# Patient Record
Sex: Male | Born: 1940 | Race: White | Hispanic: No | Marital: Married | Smoking: Former smoker
Health system: Southern US, Community
[De-identification: ages and names within clinical notes are randomized; demographics above are authoritative.]

## PROBLEM LIST (undated history)

## (undated) DIAGNOSIS — I10 Essential (primary) hypertension: Secondary | ICD-10-CM

## (undated) DIAGNOSIS — N189 Chronic kidney disease, unspecified: Secondary | ICD-10-CM

## (undated) HISTORY — PX: OTHER SURGICAL HISTORY: SHX169

## (undated) HISTORY — PX: NO PAST SURGERIES: SHX2092

---

## 2012-01-04 DIAGNOSIS — Z79899 Other long term (current) drug therapy: Secondary | ICD-10-CM | POA: Diagnosis not present

## 2012-01-04 DIAGNOSIS — E782 Mixed hyperlipidemia: Secondary | ICD-10-CM | POA: Diagnosis not present

## 2012-01-04 DIAGNOSIS — N182 Chronic kidney disease, stage 2 (mild): Secondary | ICD-10-CM | POA: Diagnosis not present

## 2012-01-04 DIAGNOSIS — E1129 Type 2 diabetes mellitus with other diabetic kidney complication: Secondary | ICD-10-CM | POA: Diagnosis not present

## 2012-01-07 DIAGNOSIS — N400 Enlarged prostate without lower urinary tract symptoms: Secondary | ICD-10-CM | POA: Diagnosis not present

## 2012-01-07 DIAGNOSIS — E1129 Type 2 diabetes mellitus with other diabetic kidney complication: Secondary | ICD-10-CM | POA: Diagnosis not present

## 2012-01-07 DIAGNOSIS — E782 Mixed hyperlipidemia: Secondary | ICD-10-CM | POA: Diagnosis not present

## 2012-01-07 DIAGNOSIS — I10 Essential (primary) hypertension: Secondary | ICD-10-CM | POA: Diagnosis not present

## 2012-02-02 DIAGNOSIS — H409 Unspecified glaucoma: Secondary | ICD-10-CM | POA: Diagnosis not present

## 2012-02-02 DIAGNOSIS — H4011X0 Primary open-angle glaucoma, stage unspecified: Secondary | ICD-10-CM | POA: Diagnosis not present

## 2012-02-02 DIAGNOSIS — Z961 Presence of intraocular lens: Secondary | ICD-10-CM | POA: Diagnosis not present

## 2012-02-02 DIAGNOSIS — E119 Type 2 diabetes mellitus without complications: Secondary | ICD-10-CM | POA: Diagnosis not present

## 2012-05-17 DIAGNOSIS — E782 Mixed hyperlipidemia: Secondary | ICD-10-CM | POA: Diagnosis not present

## 2012-05-17 DIAGNOSIS — E1129 Type 2 diabetes mellitus with other diabetic kidney complication: Secondary | ICD-10-CM | POA: Diagnosis not present

## 2012-05-17 DIAGNOSIS — N182 Chronic kidney disease, stage 2 (mild): Secondary | ICD-10-CM | POA: Diagnosis not present

## 2012-05-19 DIAGNOSIS — E1129 Type 2 diabetes mellitus with other diabetic kidney complication: Secondary | ICD-10-CM | POA: Diagnosis not present

## 2012-05-19 DIAGNOSIS — E782 Mixed hyperlipidemia: Secondary | ICD-10-CM | POA: Diagnosis not present

## 2012-05-19 DIAGNOSIS — N182 Chronic kidney disease, stage 2 (mild): Secondary | ICD-10-CM | POA: Diagnosis not present

## 2012-05-19 DIAGNOSIS — I10 Essential (primary) hypertension: Secondary | ICD-10-CM | POA: Diagnosis not present

## 2012-07-27 DIAGNOSIS — H4011X0 Primary open-angle glaucoma, stage unspecified: Secondary | ICD-10-CM | POA: Diagnosis not present

## 2012-07-27 DIAGNOSIS — H409 Unspecified glaucoma: Secondary | ICD-10-CM | POA: Diagnosis not present

## 2012-09-19 DIAGNOSIS — E1129 Type 2 diabetes mellitus with other diabetic kidney complication: Secondary | ICD-10-CM | POA: Diagnosis not present

## 2012-09-19 DIAGNOSIS — E782 Mixed hyperlipidemia: Secondary | ICD-10-CM | POA: Diagnosis not present

## 2012-09-19 DIAGNOSIS — Z125 Encounter for screening for malignant neoplasm of prostate: Secondary | ICD-10-CM | POA: Diagnosis not present

## 2012-09-22 DIAGNOSIS — N182 Chronic kidney disease, stage 2 (mild): Secondary | ICD-10-CM | POA: Diagnosis not present

## 2012-09-22 DIAGNOSIS — I10 Essential (primary) hypertension: Secondary | ICD-10-CM | POA: Diagnosis not present

## 2012-09-22 DIAGNOSIS — E782 Mixed hyperlipidemia: Secondary | ICD-10-CM | POA: Diagnosis not present

## 2012-09-22 DIAGNOSIS — E1129 Type 2 diabetes mellitus with other diabetic kidney complication: Secondary | ICD-10-CM | POA: Diagnosis not present

## 2013-01-24 DIAGNOSIS — E1129 Type 2 diabetes mellitus with other diabetic kidney complication: Secondary | ICD-10-CM | POA: Diagnosis not present

## 2013-01-24 DIAGNOSIS — E782 Mixed hyperlipidemia: Secondary | ICD-10-CM | POA: Diagnosis not present

## 2013-01-26 DIAGNOSIS — Z9181 History of falling: Secondary | ICD-10-CM | POA: Diagnosis not present

## 2013-01-26 DIAGNOSIS — Z6828 Body mass index (BMI) 28.0-28.9, adult: Secondary | ICD-10-CM | POA: Diagnosis not present

## 2013-01-26 DIAGNOSIS — Z1331 Encounter for screening for depression: Secondary | ICD-10-CM | POA: Diagnosis not present

## 2013-01-26 DIAGNOSIS — E1129 Type 2 diabetes mellitus with other diabetic kidney complication: Secondary | ICD-10-CM | POA: Diagnosis not present

## 2013-01-29 DIAGNOSIS — H409 Unspecified glaucoma: Secondary | ICD-10-CM | POA: Diagnosis not present

## 2013-01-29 DIAGNOSIS — E119 Type 2 diabetes mellitus without complications: Secondary | ICD-10-CM | POA: Diagnosis not present

## 2013-01-29 DIAGNOSIS — H4011X0 Primary open-angle glaucoma, stage unspecified: Secondary | ICD-10-CM | POA: Diagnosis not present

## 2013-05-28 DIAGNOSIS — E1129 Type 2 diabetes mellitus with other diabetic kidney complication: Secondary | ICD-10-CM | POA: Diagnosis not present

## 2013-05-28 DIAGNOSIS — E782 Mixed hyperlipidemia: Secondary | ICD-10-CM | POA: Diagnosis not present

## 2013-05-30 DIAGNOSIS — E782 Mixed hyperlipidemia: Secondary | ICD-10-CM | POA: Diagnosis not present

## 2013-05-30 DIAGNOSIS — N182 Chronic kidney disease, stage 2 (mild): Secondary | ICD-10-CM | POA: Diagnosis not present

## 2013-05-30 DIAGNOSIS — E1129 Type 2 diabetes mellitus with other diabetic kidney complication: Secondary | ICD-10-CM | POA: Diagnosis not present

## 2013-05-30 DIAGNOSIS — I10 Essential (primary) hypertension: Secondary | ICD-10-CM | POA: Diagnosis not present

## 2013-08-23 DIAGNOSIS — E119 Type 2 diabetes mellitus without complications: Secondary | ICD-10-CM | POA: Diagnosis not present

## 2013-08-23 DIAGNOSIS — H4011X0 Primary open-angle glaucoma, stage unspecified: Secondary | ICD-10-CM | POA: Diagnosis not present

## 2013-08-23 DIAGNOSIS — H409 Unspecified glaucoma: Secondary | ICD-10-CM | POA: Diagnosis not present

## 2013-09-28 DIAGNOSIS — Z125 Encounter for screening for malignant neoplasm of prostate: Secondary | ICD-10-CM | POA: Diagnosis not present

## 2013-09-28 DIAGNOSIS — E1129 Type 2 diabetes mellitus with other diabetic kidney complication: Secondary | ICD-10-CM | POA: Diagnosis not present

## 2013-09-28 DIAGNOSIS — E782 Mixed hyperlipidemia: Secondary | ICD-10-CM | POA: Diagnosis not present

## 2013-10-03 DIAGNOSIS — I129 Hypertensive chronic kidney disease with stage 1 through stage 4 chronic kidney disease, or unspecified chronic kidney disease: Secondary | ICD-10-CM | POA: Diagnosis not present

## 2013-10-03 DIAGNOSIS — E782 Mixed hyperlipidemia: Secondary | ICD-10-CM | POA: Diagnosis not present

## 2013-10-03 DIAGNOSIS — E1129 Type 2 diabetes mellitus with other diabetic kidney complication: Secondary | ICD-10-CM | POA: Diagnosis not present

## 2014-02-12 DIAGNOSIS — E1129 Type 2 diabetes mellitus with other diabetic kidney complication: Secondary | ICD-10-CM | POA: Diagnosis not present

## 2014-02-12 DIAGNOSIS — N183 Chronic kidney disease, stage 3 unspecified: Secondary | ICD-10-CM | POA: Diagnosis not present

## 2014-02-12 DIAGNOSIS — E782 Mixed hyperlipidemia: Secondary | ICD-10-CM | POA: Diagnosis not present

## 2014-02-15 DIAGNOSIS — Z1342 Encounter for screening for global developmental delays (milestones): Secondary | ICD-10-CM | POA: Diagnosis not present

## 2014-02-15 DIAGNOSIS — H4011X0 Primary open-angle glaucoma, stage unspecified: Secondary | ICD-10-CM | POA: Diagnosis not present

## 2014-02-15 DIAGNOSIS — E1129 Type 2 diabetes mellitus with other diabetic kidney complication: Secondary | ICD-10-CM | POA: Diagnosis not present

## 2014-02-15 DIAGNOSIS — Z1330 Encounter for screening examination for mental health and behavioral disorders, unspecified: Secondary | ICD-10-CM | POA: Diagnosis not present

## 2014-02-15 DIAGNOSIS — Z9181 History of falling: Secondary | ICD-10-CM | POA: Diagnosis not present

## 2014-02-15 DIAGNOSIS — H409 Unspecified glaucoma: Secondary | ICD-10-CM | POA: Diagnosis not present

## 2014-02-15 DIAGNOSIS — N400 Enlarged prostate without lower urinary tract symptoms: Secondary | ICD-10-CM | POA: Diagnosis not present

## 2014-02-15 DIAGNOSIS — I10 Essential (primary) hypertension: Secondary | ICD-10-CM | POA: Diagnosis not present

## 2014-02-15 DIAGNOSIS — E782 Mixed hyperlipidemia: Secondary | ICD-10-CM | POA: Diagnosis not present

## 2014-06-18 DIAGNOSIS — E782 Mixed hyperlipidemia: Secondary | ICD-10-CM | POA: Diagnosis not present

## 2014-06-18 DIAGNOSIS — E1129 Type 2 diabetes mellitus with other diabetic kidney complication: Secondary | ICD-10-CM | POA: Diagnosis not present

## 2014-06-19 DIAGNOSIS — N400 Enlarged prostate without lower urinary tract symptoms: Secondary | ICD-10-CM | POA: Diagnosis not present

## 2014-06-19 DIAGNOSIS — E1129 Type 2 diabetes mellitus with other diabetic kidney complication: Secondary | ICD-10-CM | POA: Diagnosis not present

## 2014-06-19 DIAGNOSIS — I10 Essential (primary) hypertension: Secondary | ICD-10-CM | POA: Diagnosis not present

## 2014-06-19 DIAGNOSIS — E782 Mixed hyperlipidemia: Secondary | ICD-10-CM | POA: Diagnosis not present

## 2014-10-22 DIAGNOSIS — E1129 Type 2 diabetes mellitus with other diabetic kidney complication: Secondary | ICD-10-CM | POA: Diagnosis not present

## 2014-10-22 DIAGNOSIS — E782 Mixed hyperlipidemia: Secondary | ICD-10-CM | POA: Diagnosis not present

## 2014-10-22 DIAGNOSIS — Z125 Encounter for screening for malignant neoplasm of prostate: Secondary | ICD-10-CM | POA: Diagnosis not present

## 2014-10-28 DIAGNOSIS — N183 Chronic kidney disease, stage 3 (moderate): Secondary | ICD-10-CM | POA: Diagnosis not present

## 2014-10-28 DIAGNOSIS — E1129 Type 2 diabetes mellitus with other diabetic kidney complication: Secondary | ICD-10-CM | POA: Diagnosis not present

## 2014-10-28 DIAGNOSIS — Z1389 Encounter for screening for other disorder: Secondary | ICD-10-CM | POA: Diagnosis not present

## 2014-10-28 DIAGNOSIS — Z9181 History of falling: Secondary | ICD-10-CM | POA: Diagnosis not present

## 2014-10-28 DIAGNOSIS — Z6827 Body mass index (BMI) 27.0-27.9, adult: Secondary | ICD-10-CM | POA: Diagnosis not present

## 2014-10-28 DIAGNOSIS — I10 Essential (primary) hypertension: Secondary | ICD-10-CM | POA: Diagnosis not present

## 2014-10-28 DIAGNOSIS — E782 Mixed hyperlipidemia: Secondary | ICD-10-CM | POA: Diagnosis not present

## 2014-10-28 DIAGNOSIS — N400 Enlarged prostate without lower urinary tract symptoms: Secondary | ICD-10-CM | POA: Diagnosis not present

## 2015-03-03 DIAGNOSIS — E782 Mixed hyperlipidemia: Secondary | ICD-10-CM | POA: Diagnosis not present

## 2015-03-03 DIAGNOSIS — N182 Chronic kidney disease, stage 2 (mild): Secondary | ICD-10-CM | POA: Diagnosis not present

## 2015-03-03 DIAGNOSIS — E1129 Type 2 diabetes mellitus with other diabetic kidney complication: Secondary | ICD-10-CM | POA: Diagnosis not present

## 2015-03-11 DIAGNOSIS — N183 Chronic kidney disease, stage 3 (moderate): Secondary | ICD-10-CM | POA: Diagnosis not present

## 2015-03-11 DIAGNOSIS — I10 Essential (primary) hypertension: Secondary | ICD-10-CM | POA: Diagnosis not present

## 2015-03-11 DIAGNOSIS — E1129 Type 2 diabetes mellitus with other diabetic kidney complication: Secondary | ICD-10-CM | POA: Diagnosis not present

## 2015-03-11 DIAGNOSIS — N400 Enlarged prostate without lower urinary tract symptoms: Secondary | ICD-10-CM | POA: Diagnosis not present

## 2015-03-11 DIAGNOSIS — Z1389 Encounter for screening for other disorder: Secondary | ICD-10-CM | POA: Diagnosis not present

## 2015-03-11 DIAGNOSIS — Z6827 Body mass index (BMI) 27.0-27.9, adult: Secondary | ICD-10-CM | POA: Diagnosis not present

## 2015-03-11 DIAGNOSIS — E782 Mixed hyperlipidemia: Secondary | ICD-10-CM | POA: Diagnosis not present

## 2015-03-11 DIAGNOSIS — Z9181 History of falling: Secondary | ICD-10-CM | POA: Diagnosis not present

## 2015-07-01 DIAGNOSIS — H4011X1 Primary open-angle glaucoma, mild stage: Secondary | ICD-10-CM | POA: Diagnosis not present

## 2015-07-01 DIAGNOSIS — E119 Type 2 diabetes mellitus without complications: Secondary | ICD-10-CM | POA: Diagnosis not present

## 2015-07-09 DIAGNOSIS — E782 Mixed hyperlipidemia: Secondary | ICD-10-CM | POA: Diagnosis not present

## 2015-07-09 DIAGNOSIS — E1129 Type 2 diabetes mellitus with other diabetic kidney complication: Secondary | ICD-10-CM | POA: Diagnosis not present

## 2015-07-11 DIAGNOSIS — L570 Actinic keratosis: Secondary | ICD-10-CM | POA: Diagnosis not present

## 2015-07-11 DIAGNOSIS — Z9181 History of falling: Secondary | ICD-10-CM | POA: Diagnosis not present

## 2015-07-11 DIAGNOSIS — Z1389 Encounter for screening for other disorder: Secondary | ICD-10-CM | POA: Diagnosis not present

## 2015-07-11 DIAGNOSIS — E1129 Type 2 diabetes mellitus with other diabetic kidney complication: Secondary | ICD-10-CM | POA: Diagnosis not present

## 2015-07-11 DIAGNOSIS — N183 Chronic kidney disease, stage 3 (moderate): Secondary | ICD-10-CM | POA: Diagnosis not present

## 2015-07-11 DIAGNOSIS — E782 Mixed hyperlipidemia: Secondary | ICD-10-CM | POA: Diagnosis not present

## 2015-07-11 DIAGNOSIS — I10 Essential (primary) hypertension: Secondary | ICD-10-CM | POA: Diagnosis not present

## 2015-07-11 DIAGNOSIS — Z6827 Body mass index (BMI) 27.0-27.9, adult: Secondary | ICD-10-CM | POA: Diagnosis not present

## 2015-09-29 DIAGNOSIS — N63 Unspecified lump in breast: Secondary | ICD-10-CM | POA: Diagnosis not present

## 2015-09-29 DIAGNOSIS — Z6828 Body mass index (BMI) 28.0-28.9, adult: Secondary | ICD-10-CM | POA: Diagnosis not present

## 2015-10-13 DIAGNOSIS — H401111 Primary open-angle glaucoma, right eye, mild stage: Secondary | ICD-10-CM | POA: Diagnosis not present

## 2015-10-13 DIAGNOSIS — H52203 Unspecified astigmatism, bilateral: Secondary | ICD-10-CM | POA: Diagnosis not present

## 2015-10-13 DIAGNOSIS — Z961 Presence of intraocular lens: Secondary | ICD-10-CM | POA: Diagnosis not present

## 2015-10-13 DIAGNOSIS — H401122 Primary open-angle glaucoma, left eye, moderate stage: Secondary | ICD-10-CM | POA: Diagnosis not present

## 2015-11-11 DIAGNOSIS — E1129 Type 2 diabetes mellitus with other diabetic kidney complication: Secondary | ICD-10-CM | POA: Diagnosis not present

## 2015-11-11 DIAGNOSIS — E782 Mixed hyperlipidemia: Secondary | ICD-10-CM | POA: Diagnosis not present

## 2015-11-11 DIAGNOSIS — Z125 Encounter for screening for malignant neoplasm of prostate: Secondary | ICD-10-CM | POA: Diagnosis not present

## 2015-11-14 DIAGNOSIS — Z6828 Body mass index (BMI) 28.0-28.9, adult: Secondary | ICD-10-CM | POA: Diagnosis not present

## 2015-11-14 DIAGNOSIS — E782 Mixed hyperlipidemia: Secondary | ICD-10-CM | POA: Diagnosis not present

## 2015-11-14 DIAGNOSIS — E1129 Type 2 diabetes mellitus with other diabetic kidney complication: Secondary | ICD-10-CM | POA: Diagnosis not present

## 2015-11-14 DIAGNOSIS — N183 Chronic kidney disease, stage 3 (moderate): Secondary | ICD-10-CM | POA: Diagnosis not present

## 2015-11-14 DIAGNOSIS — I10 Essential (primary) hypertension: Secondary | ICD-10-CM | POA: Diagnosis not present

## 2015-12-17 DIAGNOSIS — Z6828 Body mass index (BMI) 28.0-28.9, adult: Secondary | ICD-10-CM | POA: Diagnosis not present

## 2015-12-17 DIAGNOSIS — E663 Overweight: Secondary | ICD-10-CM | POA: Diagnosis not present

## 2015-12-17 DIAGNOSIS — R220 Localized swelling, mass and lump, head: Secondary | ICD-10-CM | POA: Diagnosis not present

## 2015-12-25 DIAGNOSIS — D170 Benign lipomatous neoplasm of skin and subcutaneous tissue of head, face and neck: Secondary | ICD-10-CM | POA: Diagnosis not present

## 2016-01-16 DIAGNOSIS — Z7984 Long term (current) use of oral hypoglycemic drugs: Secondary | ICD-10-CM | POA: Diagnosis not present

## 2016-01-16 DIAGNOSIS — D170 Benign lipomatous neoplasm of skin and subcutaneous tissue of head, face and neck: Secondary | ICD-10-CM | POA: Diagnosis not present

## 2016-01-16 DIAGNOSIS — I10 Essential (primary) hypertension: Secondary | ICD-10-CM | POA: Diagnosis not present

## 2016-01-16 DIAGNOSIS — Z79899 Other long term (current) drug therapy: Secondary | ICD-10-CM | POA: Diagnosis not present

## 2016-01-16 DIAGNOSIS — Z7982 Long term (current) use of aspirin: Secondary | ICD-10-CM | POA: Diagnosis not present

## 2016-01-16 DIAGNOSIS — E119 Type 2 diabetes mellitus without complications: Secondary | ICD-10-CM | POA: Diagnosis not present

## 2016-02-09 DIAGNOSIS — I10 Essential (primary) hypertension: Secondary | ICD-10-CM | POA: Diagnosis not present

## 2016-02-09 DIAGNOSIS — Z6828 Body mass index (BMI) 28.0-28.9, adult: Secondary | ICD-10-CM | POA: Diagnosis not present

## 2016-02-09 DIAGNOSIS — R600 Localized edema: Secondary | ICD-10-CM | POA: Diagnosis not present

## 2016-03-15 DIAGNOSIS — E1129 Type 2 diabetes mellitus with other diabetic kidney complication: Secondary | ICD-10-CM | POA: Diagnosis not present

## 2016-03-15 DIAGNOSIS — E782 Mixed hyperlipidemia: Secondary | ICD-10-CM | POA: Diagnosis not present

## 2016-03-19 DIAGNOSIS — E1129 Type 2 diabetes mellitus with other diabetic kidney complication: Secondary | ICD-10-CM | POA: Diagnosis not present

## 2016-03-19 DIAGNOSIS — E782 Mixed hyperlipidemia: Secondary | ICD-10-CM | POA: Diagnosis not present

## 2016-03-19 DIAGNOSIS — R809 Proteinuria, unspecified: Secondary | ICD-10-CM | POA: Diagnosis not present

## 2016-03-19 DIAGNOSIS — E663 Overweight: Secondary | ICD-10-CM | POA: Diagnosis not present

## 2016-03-19 DIAGNOSIS — I10 Essential (primary) hypertension: Secondary | ICD-10-CM | POA: Diagnosis not present

## 2016-03-19 DIAGNOSIS — Z6828 Body mass index (BMI) 28.0-28.9, adult: Secondary | ICD-10-CM | POA: Diagnosis not present

## 2016-03-19 DIAGNOSIS — N183 Chronic kidney disease, stage 3 (moderate): Secondary | ICD-10-CM | POA: Diagnosis not present

## 2016-03-26 DIAGNOSIS — Z6828 Body mass index (BMI) 28.0-28.9, adult: Secondary | ICD-10-CM | POA: Diagnosis not present

## 2016-03-26 DIAGNOSIS — H6123 Impacted cerumen, bilateral: Secondary | ICD-10-CM | POA: Diagnosis not present

## 2016-04-12 DIAGNOSIS — H401122 Primary open-angle glaucoma, left eye, moderate stage: Secondary | ICD-10-CM | POA: Diagnosis not present

## 2016-04-12 DIAGNOSIS — E119 Type 2 diabetes mellitus without complications: Secondary | ICD-10-CM | POA: Diagnosis not present

## 2016-04-12 DIAGNOSIS — H401111 Primary open-angle glaucoma, right eye, mild stage: Secondary | ICD-10-CM | POA: Diagnosis not present

## 2016-06-01 DIAGNOSIS — N183 Chronic kidney disease, stage 3 (moderate): Secondary | ICD-10-CM | POA: Diagnosis not present

## 2016-06-01 DIAGNOSIS — D631 Anemia in chronic kidney disease: Secondary | ICD-10-CM | POA: Diagnosis not present

## 2016-06-01 DIAGNOSIS — N2581 Secondary hyperparathyroidism of renal origin: Secondary | ICD-10-CM | POA: Diagnosis not present

## 2016-06-01 DIAGNOSIS — I10 Essential (primary) hypertension: Secondary | ICD-10-CM | POA: Diagnosis not present

## 2016-06-01 DIAGNOSIS — E1129 Type 2 diabetes mellitus with other diabetic kidney complication: Secondary | ICD-10-CM | POA: Diagnosis not present

## 2016-07-20 DIAGNOSIS — N183 Chronic kidney disease, stage 3 (moderate): Secondary | ICD-10-CM | POA: Diagnosis not present

## 2016-07-20 DIAGNOSIS — E782 Mixed hyperlipidemia: Secondary | ICD-10-CM | POA: Diagnosis not present

## 2016-07-20 DIAGNOSIS — E1129 Type 2 diabetes mellitus with other diabetic kidney complication: Secondary | ICD-10-CM | POA: Diagnosis not present

## 2016-07-22 DIAGNOSIS — Z9181 History of falling: Secondary | ICD-10-CM | POA: Diagnosis not present

## 2016-07-22 DIAGNOSIS — E1129 Type 2 diabetes mellitus with other diabetic kidney complication: Secondary | ICD-10-CM | POA: Diagnosis not present

## 2016-07-22 DIAGNOSIS — Z6828 Body mass index (BMI) 28.0-28.9, adult: Secondary | ICD-10-CM | POA: Diagnosis not present

## 2016-07-22 DIAGNOSIS — I10 Essential (primary) hypertension: Secondary | ICD-10-CM | POA: Diagnosis not present

## 2016-07-22 DIAGNOSIS — E782 Mixed hyperlipidemia: Secondary | ICD-10-CM | POA: Diagnosis not present

## 2016-07-22 DIAGNOSIS — N183 Chronic kidney disease, stage 3 (moderate): Secondary | ICD-10-CM | POA: Diagnosis not present

## 2016-07-22 DIAGNOSIS — Z1389 Encounter for screening for other disorder: Secondary | ICD-10-CM | POA: Diagnosis not present

## 2016-09-07 DIAGNOSIS — N2581 Secondary hyperparathyroidism of renal origin: Secondary | ICD-10-CM | POA: Diagnosis not present

## 2016-09-07 DIAGNOSIS — N183 Chronic kidney disease, stage 3 (moderate): Secondary | ICD-10-CM | POA: Diagnosis not present

## 2016-09-14 DIAGNOSIS — N2581 Secondary hyperparathyroidism of renal origin: Secondary | ICD-10-CM | POA: Diagnosis not present

## 2016-09-14 DIAGNOSIS — N183 Chronic kidney disease, stage 3 (moderate): Secondary | ICD-10-CM | POA: Diagnosis not present

## 2016-09-14 DIAGNOSIS — D631 Anemia in chronic kidney disease: Secondary | ICD-10-CM | POA: Diagnosis not present

## 2016-09-14 DIAGNOSIS — Z6829 Body mass index (BMI) 29.0-29.9, adult: Secondary | ICD-10-CM | POA: Diagnosis not present

## 2016-09-14 DIAGNOSIS — I129 Hypertensive chronic kidney disease with stage 1 through stage 4 chronic kidney disease, or unspecified chronic kidney disease: Secondary | ICD-10-CM | POA: Diagnosis not present

## 2016-09-15 ENCOUNTER — Other Ambulatory Visit (HOSPITAL_COMMUNITY): Payer: Self-pay | Admitting: Nephrology

## 2016-09-15 DIAGNOSIS — N183 Chronic kidney disease, stage 3 unspecified: Secondary | ICD-10-CM

## 2016-09-23 ENCOUNTER — Other Ambulatory Visit: Payer: Self-pay | Admitting: Radiology

## 2016-09-24 ENCOUNTER — Other Ambulatory Visit: Payer: Self-pay | Admitting: Student

## 2016-09-27 ENCOUNTER — Encounter (HOSPITAL_COMMUNITY): Payer: Self-pay

## 2016-09-27 ENCOUNTER — Ambulatory Visit (HOSPITAL_COMMUNITY)
Admission: RE | Admit: 2016-09-27 | Discharge: 2016-09-27 | Disposition: A | Discharge status: Home / Self Care | Payer: Medicare Other | Source: Ambulatory Visit | Attending: Nephrology | Admitting: Nephrology

## 2016-09-27 DIAGNOSIS — E1122 Type 2 diabetes mellitus with diabetic chronic kidney disease: Secondary | ICD-10-CM | POA: Insufficient documentation

## 2016-09-27 DIAGNOSIS — N183 Chronic kidney disease, stage 3 unspecified: Secondary | ICD-10-CM

## 2016-09-27 DIAGNOSIS — Z87891 Personal history of nicotine dependence: Secondary | ICD-10-CM | POA: Insufficient documentation

## 2016-09-27 DIAGNOSIS — Z7982 Long term (current) use of aspirin: Secondary | ICD-10-CM | POA: Insufficient documentation

## 2016-09-27 DIAGNOSIS — I129 Hypertensive chronic kidney disease with stage 1 through stage 4 chronic kidney disease, or unspecified chronic kidney disease: Secondary | ICD-10-CM | POA: Diagnosis not present

## 2016-09-27 DIAGNOSIS — Z79899 Other long term (current) drug therapy: Secondary | ICD-10-CM | POA: Insufficient documentation

## 2016-09-27 DIAGNOSIS — N049 Nephrotic syndrome with unspecified morphologic changes: Secondary | ICD-10-CM | POA: Diagnosis not present

## 2016-09-27 HISTORY — DX: Chronic kidney disease, unspecified: N18.9

## 2016-09-27 HISTORY — DX: Essential (primary) hypertension: I10

## 2016-09-27 LAB — BASIC METABOLIC PANEL
Anion gap: 12 (ref 5–15)
BUN: 28 mg/dL — ABNORMAL HIGH (ref 6–20)
CO2: 24 mmol/L (ref 22–32)
Calcium: 9.3 mg/dL (ref 8.9–10.3)
Chloride: 100 mmol/L — ABNORMAL LOW (ref 101–111)
Creatinine, Ser: 1.73 mg/dL — ABNORMAL HIGH (ref 0.61–1.24)
GFR calc Af Amer: 42 mL/min — ABNORMAL LOW (ref >60)
GFR calc non Af Amer: 37 mL/min — ABNORMAL LOW (ref >60)
GLUCOSE: 156 mg/dL — AB (ref 65–99)
Potassium: 3.4 mmol/L — ABNORMAL LOW (ref 3.5–5.1)
SODIUM: 136 mmol/L (ref 135–145)

## 2016-09-27 LAB — CBC WITH DIFFERENTIAL/PLATELET
BASOS PCT: 2 %
Basophils Absolute: 0.1 10*3/uL (ref 0.0–0.1)
Eosinophils Absolute: 0.3 10*3/uL (ref 0.0–0.7)
Eosinophils Relative: 4 %
HEMATOCRIT: 37.3 % — AB (ref 39.0–52.0)
HEMOGLOBIN: 13.3 g/dL (ref 13.0–17.0)
LYMPHS PCT: 43 %
Lymphs Abs: 2.7 10*3/uL (ref 0.7–4.0)
MCH: 32.9 pg (ref 26.0–34.0)
MCHC: 35.7 g/dL (ref 30.0–36.0)
MCV: 92.3 fL (ref 78.0–100.0)
MONO ABS: 0.6 10*3/uL (ref 0.1–1.0)
MONOS PCT: 9 %
NEUTROS ABS: 2.6 10*3/uL (ref 1.7–7.7)
NEUTROS PCT: 42 %
Platelets: 295 10*3/uL (ref 150–400)
RBC: 4.04 MIL/uL — ABNORMAL LOW (ref 4.22–5.81)
RDW: 12.2 % (ref 11.5–15.5)
WBC: 6.2 10*3/uL (ref 4.0–10.5)

## 2016-09-27 LAB — PROTIME-INR
INR: 0.86
Prothrombin Time: 11.7 seconds (ref 11.4–15.2)

## 2016-09-27 MED ORDER — MIDAZOLAM HCL 2 MG/2ML IJ SOLN
INTRAMUSCULAR | Status: AC
  Filled 2016-09-27: qty 2

## 2016-09-27 MED ORDER — LIDOCAINE HCL 1 % IJ SOLN
INTRAMUSCULAR | Status: AC
  Filled 2016-09-27: qty 20

## 2016-09-27 MED ORDER — SODIUM CHLORIDE 0.9 % IV SOLN: INTRAVENOUS | Status: DC

## 2016-09-27 MED ORDER — FENTANYL CITRATE (PF) 100 MCG/2ML IJ SOLN
INTRAMUSCULAR | Status: AC
  Filled 2016-09-27: qty 2

## 2016-09-27 MED ORDER — FENTANYL CITRATE (PF) 100 MCG/2ML IJ SOLN
INTRAMUSCULAR | Status: AC | PRN
  Administered 2016-09-27: 50 ug via INTRAVENOUS
  Administered 2016-09-27: 25 ug via INTRAVENOUS

## 2016-09-27 MED ORDER — MIDAZOLAM HCL 2 MG/2ML IJ SOLN
INTRAMUSCULAR | Status: AC | PRN
  Administered 2016-09-27: 0.5 mg via INTRAVENOUS
  Administered 2016-09-27: 1 mg via INTRAVENOUS
  Administered 2016-09-27: 0.5 mg via INTRAVENOUS

## 2016-09-27 MED ORDER — SODIUM CHLORIDE 0.9 % IV SOLN
INTRAVENOUS | Status: AC | PRN
  Administered 2016-09-27: 10 mL/h via INTRAVENOUS

## 2016-09-27 NOTE — Procedures (Signed)
[place] RLP renal core 16g x2 to surg path No complication No blood loss. See complete dictation in [facility].

## 2016-09-27 NOTE — Discharge Instructions (Signed)
Percutaneous Kidney Biopsy, Care After °This sheet gives you information about how to care for yourself after your procedure. Your health care provider may also give you more specific instructions. If you have problems or questions, contact your health care provider. °What can I expect after the procedure? °After the procedure, it is common to have: °· Pain or soreness near the area where the needle went through your skin (biopsy site). °· Bright pink or cloudy urine for 24 hours after the procedure. °Follow these instructions at home: °Activity  °· Return to your normal activities as told by your health care provider. Ask your health care provider what activities are safe for you. °· Do not drive for 24 hours if you were given a medicine to help you relax (sedative). °· Do not lift anything that is heavier than 10 lb (4.5 kg) until your health care provider tells you that it is safe. °· Avoid activities that take a lot of effort (are strenuous) until your health care provider approves. Most people will have to wait 2 weeks before returning to activities such as exercise or sexual intercourse. °General instructions  °· Take over-the-counter and prescription medicines only as told by your health care provider. °· You may eat and drink after your procedure. Follow instructions from your health care provider about eating or drinking restrictions. °· Check your biopsy site every day for signs of infection. Check for: °¨ More redness, swelling, or pain. °¨ More fluid or blood. °¨ Warmth. °¨ Pus or a bad smell. °· Keep all follow-up visits as told by your health care provider. This is important. °Contact a health care provider if: °· You have more redness, swelling, or pain around your biopsy site. °· You have more fluid or blood coming from your biopsy site. °· Your biopsy site feels warm to the touch. °· You have pus or a bad smell coming from your biopsy site. °· You have blood in your urine more than 24 hours after  your procedure. °Get help right away if: °· You have dark red or brown urine. °· You have a fever. °· You are unable to urinate. °· You feel burning when you urinate. °· You feel faint. °· You have severe pain in your abdomen or side. °This information is not intended to replace advice given to you by your health care provider. Make sure you discuss any questions you have with your health care provider. °Document Released: 06/13/2013 Document Revised: 07/23/2016 Document Reviewed: 07/23/2016 °Elsevier Interactive Patient Education © 2017 [employer] ° °

## 2016-09-27 NOTE — H&P (Signed)
Chief Complaint: Patient was seen in consultation today for random renal biopsy at the request of [place]  Referring Physician(s): Patel,Jay  Supervising Physician: Arne Cleveland  Patient Status: [facility] - Out-pt  History of Present Illness: Charles Hanson. is a 75 y.o. male   Hx HTN; DM Known CKD stage III Follows with Dr Posey Pronto Persistent proteinuria Nephrotic syndrome Scheduled now for random renal biopsy    Past Medical History:  Diagnosis Date  . Chronic kidney disease   . Hypertension     Past Surgical History:  Procedure Laterality Date  . NO PAST SURGERIES      Allergies: Patient has no known allergies.  Medications: Prior to Admission medications   Medication Sig Start Date End Date Taking? Authorizing Provider  amLODipine (NORVASC) 10 MG tablet Take 10 mg by mouth daily.   Yes Historical Provider, MD  aspirin 81 MG tablet Take 81 mg by mouth daily.   Yes Historical Provider, MD  Cholecalciferol (VITAMIN D3) 2000 units TABS Take 2 tablets by mouth daily.   Yes Historical Provider, MD  dorzolamide (TRUSOPT) 2 % ophthalmic solution Place 1 drop into both eyes 2 (two) times daily.   Yes Historical Provider, MD  latanoprost (XALATAN) 0.005 % ophthalmic solution Place 1 drop into both eyes at bedtime.   Yes Historical Provider, MD  losartan-hydrochlorothiazide (HYZAAR) 100-25 MG tablet Take 1 tablet by mouth daily.   Yes Historical Provider, MD  nebivolol (BYSTOLIC) 10 MG tablet Take 10 mg by mouth daily.   Yes Historical Provider, MD  Omega-3 Fatty Acids (FISH OIL OMEGA-3 PO) Take 1,600 mg elemental calcium/kg/hr by mouth daily.   Yes Historical Provider, MD  simvastatin (ZOCOR) 20 MG tablet Take 20 mg by mouth daily.   Yes Historical Provider, MD     Family History  Problem Relation Age of Onset  . Cancer - Lung Mother   . Kidney disease Father   . Heart failure Father     Social History   Social History  . Marital status: Married    Spouse  name: N/A  . Number of children: N/A  . Years of education: N/A   Social History Main Topics  . Smoking status: Former Smoker    Packs/day: 2.00    Years: 15.00    Types: Cigarettes, Cigars    Quit date: 09/27/2010  . Smokeless tobacco: Former Systems developer    Types: Chew    Quit date: 09/27/1989  . Alcohol use 1.2 oz/week    2 Shots of liquor per week  . Drug use: No  . Sexual activity: Not Asked   Other Topics Concern  . None   Social History Narrative  . None     Review of Systems: A 12 point ROS discussed and pertinent positives are indicated in the HPI above.  All other systems are negative.  Review of Systems  Constitutional: Negative for activity change, fatigue and fever.  HENT: Positive for hearing loss.   Respiratory: Negative for shortness of breath.   Psychiatric/Behavioral: Negative for behavioral problems and confusion.    Vital Signs: BP (!) 177/74   Pulse (!) 58   Temp 98.1 F (36.7 C) (Oral)   Resp 18   Ht 5\' 8"  (1.727 m)   Wt 188 lb (85.3 kg)   SpO2 99%   BMI 28.59 kg/m   Physical Exam  Constitutional: He is oriented to person, place, and time. He appears well-nourished.  Cardiovascular: Normal rate, regular rhythm and normal heart  sounds.   Pulmonary/Chest: Effort normal and breath sounds normal. He has no wheezes.  Abdominal: Soft. Bowel sounds are normal. There is no tenderness.  Musculoskeletal: Normal range of motion.  Neurological: He is alert and oriented to person, place, and time.  Skin: Skin is warm and dry.  Psychiatric: He has a normal mood and affect. His behavior is normal. Judgment and thought content normal.  Nursing note and vitals reviewed.   Mallampati Score:  MD Evaluation Airway: WNL Heart: WNL Abdomen: WNL Chest/ Lungs: WNL ASA  Classification: 3 Mallampati/Airway Score: One  Imaging: No results found.  Labs:  CBC:  Recent Labs  09/27/16 0615  WBC 6.2  HGB 13.3  HCT 37.3*  PLT 295    COAGS:  Recent  Labs  09/27/16 0615  INR 0.86    BMP:  Recent Labs  09/27/16 0615  NA 136  K 3.4*  CL 100*  CO2 24  GLUCOSE 156*  BUN 28*  CALCIUM 9.3  CREATININE 1.73*  GFRNONAA 37*  GFRAA 42*    LIVER FUNCTION TESTS: No results for input(s): BILITOT, AST, ALT, ALKPHOS, PROT, ALBUMIN in the last 8760 hours.  TUMOR MARKERS: No results for input(s): AFPTM, CEA, CA199, CHROMGRNA in the last 8760 hours.  Assessment and Plan:  Chronic Kidney disease Stage III Persistent proteinuria Nephrotic syndrome For random renal bx Risks and Benefits discussed with the patient including, but not limited to bleeding, infection, damage to adjacent structures or low yield requiring additional tests. All of the patient's questions were answered, patient is agreeable to proceed. Consent signed and in chart.    Thank you for this interesting consult.  I greatly enjoyed meeting [name]. and look forward to participating in their care.  A copy of this report was sent to the requesting provider on this date.  Electronically Signed: Monia Sabal A 09/27/2016, 7:23 AM   I spent a total of  30 Minutes   in face to face in clinical consultation, greater than 50% of which was counseling/coordinating care for random renal biopsy

## 2016-10-15 ENCOUNTER — Encounter (HOSPITAL_COMMUNITY): Payer: Self-pay

## 2016-10-19 ENCOUNTER — Encounter (HOSPITAL_COMMUNITY): Payer: Self-pay

## 2016-10-22 DIAGNOSIS — H401111 Primary open-angle glaucoma, right eye, mild stage: Secondary | ICD-10-CM | POA: Diagnosis not present

## 2016-10-22 DIAGNOSIS — H401122 Primary open-angle glaucoma, left eye, moderate stage: Secondary | ICD-10-CM | POA: Diagnosis not present

## 2016-10-22 DIAGNOSIS — H26493 Other secondary cataract, bilateral: Secondary | ICD-10-CM | POA: Diagnosis not present

## 2016-11-08 DIAGNOSIS — N183 Chronic kidney disease, stage 3 (moderate): Secondary | ICD-10-CM | POA: Diagnosis not present

## 2016-11-08 DIAGNOSIS — E782 Mixed hyperlipidemia: Secondary | ICD-10-CM | POA: Diagnosis not present

## 2016-11-08 DIAGNOSIS — E1129 Type 2 diabetes mellitus with other diabetic kidney complication: Secondary | ICD-10-CM | POA: Diagnosis not present

## 2016-11-15 DIAGNOSIS — N183 Chronic kidney disease, stage 3 (moderate): Secondary | ICD-10-CM | POA: Diagnosis not present

## 2016-11-15 DIAGNOSIS — Z6829 Body mass index (BMI) 29.0-29.9, adult: Secondary | ICD-10-CM | POA: Diagnosis not present

## 2016-11-15 DIAGNOSIS — I129 Hypertensive chronic kidney disease with stage 1 through stage 4 chronic kidney disease, or unspecified chronic kidney disease: Secondary | ICD-10-CM | POA: Diagnosis not present

## 2016-11-15 DIAGNOSIS — D631 Anemia in chronic kidney disease: Secondary | ICD-10-CM | POA: Diagnosis not present

## 2016-11-15 DIAGNOSIS — N2581 Secondary hyperparathyroidism of renal origin: Secondary | ICD-10-CM | POA: Diagnosis not present

## 2016-11-17 DIAGNOSIS — N2581 Secondary hyperparathyroidism of renal origin: Secondary | ICD-10-CM | POA: Diagnosis not present

## 2016-11-17 DIAGNOSIS — N400 Enlarged prostate without lower urinary tract symptoms: Secondary | ICD-10-CM | POA: Diagnosis not present

## 2016-11-17 DIAGNOSIS — N183 Chronic kidney disease, stage 3 (moderate): Secondary | ICD-10-CM | POA: Diagnosis not present

## 2016-11-17 DIAGNOSIS — E782 Mixed hyperlipidemia: Secondary | ICD-10-CM | POA: Diagnosis not present

## 2016-11-17 DIAGNOSIS — R600 Localized edema: Secondary | ICD-10-CM | POA: Diagnosis not present

## 2016-11-17 DIAGNOSIS — I10 Essential (primary) hypertension: Secondary | ICD-10-CM | POA: Diagnosis not present

## 2016-11-17 DIAGNOSIS — L570 Actinic keratosis: Secondary | ICD-10-CM | POA: Diagnosis not present

## 2016-11-17 DIAGNOSIS — E1129 Type 2 diabetes mellitus with other diabetic kidney complication: Secondary | ICD-10-CM | POA: Diagnosis not present

## 2016-11-25 DIAGNOSIS — H26492 Other secondary cataract, left eye: Secondary | ICD-10-CM | POA: Diagnosis not present

## 2016-12-09 DIAGNOSIS — H26491 Other secondary cataract, right eye: Secondary | ICD-10-CM | POA: Diagnosis not present

## 2017-01-10 DIAGNOSIS — N189 Chronic kidney disease, unspecified: Secondary | ICD-10-CM | POA: Diagnosis not present

## 2017-01-10 DIAGNOSIS — N2581 Secondary hyperparathyroidism of renal origin: Secondary | ICD-10-CM | POA: Diagnosis not present

## 2017-01-13 DIAGNOSIS — I129 Hypertensive chronic kidney disease with stage 1 through stage 4 chronic kidney disease, or unspecified chronic kidney disease: Secondary | ICD-10-CM | POA: Diagnosis not present

## 2017-01-13 DIAGNOSIS — N183 Chronic kidney disease, stage 3 (moderate): Secondary | ICD-10-CM | POA: Diagnosis not present

## 2017-01-13 DIAGNOSIS — D631 Anemia in chronic kidney disease: Secondary | ICD-10-CM | POA: Diagnosis not present

## 2017-01-13 DIAGNOSIS — N2581 Secondary hyperparathyroidism of renal origin: Secondary | ICD-10-CM | POA: Diagnosis not present

## 2017-01-18 DIAGNOSIS — Z6827 Body mass index (BMI) 27.0-27.9, adult: Secondary | ICD-10-CM | POA: Diagnosis not present

## 2017-01-18 DIAGNOSIS — M79671 Pain in right foot: Secondary | ICD-10-CM | POA: Diagnosis not present

## 2017-01-28 DIAGNOSIS — M722 Plantar fascial fibromatosis: Secondary | ICD-10-CM | POA: Diagnosis not present

## 2017-02-04 ENCOUNTER — Encounter: Payer: Self-pay | Admitting: Podiatry

## 2017-02-04 ENCOUNTER — Ambulatory Visit (INDEPENDENT_AMBULATORY_CARE_PROVIDER_SITE_OTHER): Payer: Medicare Other | Admitting: Podiatry

## 2017-02-04 DIAGNOSIS — M722 Plantar fascial fibromatosis: Secondary | ICD-10-CM

## 2017-02-04 DIAGNOSIS — M79671 Pain in right foot: Secondary | ICD-10-CM

## 2017-02-04 DIAGNOSIS — M19079 Primary osteoarthritis, unspecified ankle and foot: Secondary | ICD-10-CM

## 2017-02-05 NOTE — Progress Notes (Signed)
   Subjective: Patient presents today for shooting pain and tenderness in bilateral feet onset 2-3 weeks ago. He states it feels as if he has a bruise and is walking on rocks and nails. Walking and standing increase his pain. He has not tried anything as treatment. Patient states that it hurts in the mornings with the first steps out of bed. Patient presents today for further treatment and evaluation  Objective: Physical Exam General: The patient is alert and oriented x3 in no acute distress.  Dermatology: Skin is warm, dry and supple bilateral lower extremities. Negative for open lesions or macerations bilateral.   Vascular: Dorsalis Pedis and Posterior Tibial pulses palpable bilateral.  Capillary fill time is immediate to all digits.  Neurological: Epicritic and protective threshold intact bilateral.   Musculoskeletal: Tenderness to palpation at the medial calcaneal tubercale and through the insertion of the plantar fascia of the right foot. All other joints range of motion within normal limits bilateral. Strength 5/5 in all groups bilateral.    Assessment: 1. Plantar fasciitis right 2. Pain in right foot 3. Midfoot arthritis/DJD bilaterally  Plan of Care:  1. Patient evaluated. Xrays reviewed.   2. Injection of 0.5cc Celestone soluspan injected into the right heel at the insertion of the plantar fascia.  3. Instructed patient regarding therapies and modalities at home to alleviate symptoms including continue wearing Orthofeet shoes.  4. Plantar fascial band(s) dispensed. 5. Return to clinic in 4 weeks.     Edrick Kins, DPM [facility]  Dr. Edrick Kins, [address]                                        [place], [place] [address]                Office [phone]  Fax [phone]

## 2017-02-06 ENCOUNTER — Encounter (HOSPITAL_COMMUNITY): Payer: Self-pay | Admitting: Emergency Medicine

## 2017-02-06 ENCOUNTER — Emergency Department (HOSPITAL_COMMUNITY): Payer: Medicare Other

## 2017-02-06 ENCOUNTER — Inpatient Hospital Stay (HOSPITAL_COMMUNITY)
Admission: EM | Admit: 2017-02-06 | Discharge: 2017-02-13 | DRG: 280 | Disposition: A | Discharge status: Hospice | Payer: Medicare Other | Attending: Family Medicine | Admitting: Family Medicine

## 2017-02-06 DIAGNOSIS — N189 Chronic kidney disease, unspecified: Secondary | ICD-10-CM | POA: Diagnosis not present

## 2017-02-06 DIAGNOSIS — R001 Bradycardia, unspecified: Secondary | ICD-10-CM | POA: Diagnosis present

## 2017-02-06 DIAGNOSIS — J690 Pneumonitis due to inhalation of food and vomit: Secondary | ICD-10-CM

## 2017-02-06 DIAGNOSIS — I2510 Atherosclerotic heart disease of native coronary artery without angina pectoris: Secondary | ICD-10-CM | POA: Diagnosis present

## 2017-02-06 DIAGNOSIS — R0789 Other chest pain: Secondary | ICD-10-CM | POA: Diagnosis not present

## 2017-02-06 DIAGNOSIS — G464 Cerebellar stroke syndrome: Secondary | ICD-10-CM | POA: Diagnosis not present

## 2017-02-06 DIAGNOSIS — J9690 Respiratory failure, unspecified, unspecified whether with hypoxia or hypercapnia: Secondary | ICD-10-CM

## 2017-02-06 DIAGNOSIS — N179 Acute kidney failure, unspecified: Secondary | ICD-10-CM | POA: Diagnosis not present

## 2017-02-06 DIAGNOSIS — I639 Cerebral infarction, unspecified: Secondary | ICD-10-CM

## 2017-02-06 DIAGNOSIS — I472 Ventricular tachycardia: Secondary | ICD-10-CM | POA: Diagnosis not present

## 2017-02-06 DIAGNOSIS — E785 Hyperlipidemia, unspecified: Secondary | ICD-10-CM | POA: Diagnosis present

## 2017-02-06 DIAGNOSIS — R063 Periodic breathing: Secondary | ICD-10-CM | POA: Diagnosis not present

## 2017-02-06 DIAGNOSIS — R531 Weakness: Secondary | ICD-10-CM

## 2017-02-06 DIAGNOSIS — I63411 Cerebral infarction due to embolism of right middle cerebral artery: Secondary | ICD-10-CM | POA: Diagnosis not present

## 2017-02-06 DIAGNOSIS — I159 Secondary hypertension, unspecified: Secondary | ICD-10-CM | POA: Diagnosis not present

## 2017-02-06 DIAGNOSIS — F1010 Alcohol abuse, uncomplicated: Secondary | ICD-10-CM | POA: Diagnosis present

## 2017-02-06 DIAGNOSIS — J9600 Acute respiratory failure, unspecified whether with hypoxia or hypercapnia: Secondary | ICD-10-CM | POA: Diagnosis not present

## 2017-02-06 DIAGNOSIS — Z66 Do not resuscitate: Secondary | ICD-10-CM | POA: Diagnosis not present

## 2017-02-06 DIAGNOSIS — D631 Anemia in chronic kidney disease: Secondary | ICD-10-CM | POA: Diagnosis present

## 2017-02-06 DIAGNOSIS — R4182 Altered mental status, unspecified: Secondary | ICD-10-CM | POA: Diagnosis not present

## 2017-02-06 DIAGNOSIS — Z7982 Long term (current) use of aspirin: Secondary | ICD-10-CM | POA: Diagnosis not present

## 2017-02-06 DIAGNOSIS — D696 Thrombocytopenia, unspecified: Secondary | ICD-10-CM | POA: Diagnosis not present

## 2017-02-06 DIAGNOSIS — Z4659 Encounter for fitting and adjustment of other gastrointestinal appliance and device: Secondary | ICD-10-CM

## 2017-02-06 DIAGNOSIS — Z87891 Personal history of nicotine dependence: Secondary | ICD-10-CM | POA: Diagnosis not present

## 2017-02-06 DIAGNOSIS — R451 Restlessness and agitation: Secondary | ICD-10-CM | POA: Diagnosis not present

## 2017-02-06 DIAGNOSIS — E871 Hypo-osmolality and hyponatremia: Secondary | ICD-10-CM | POA: Diagnosis present

## 2017-02-06 DIAGNOSIS — E1159 Type 2 diabetes mellitus with other circulatory complications: Secondary | ICD-10-CM | POA: Diagnosis not present

## 2017-02-06 DIAGNOSIS — R634 Abnormal weight loss: Secondary | ICD-10-CM | POA: Diagnosis not present

## 2017-02-06 DIAGNOSIS — I129 Hypertensive chronic kidney disease with stage 1 through stage 4 chronic kidney disease, or unspecified chronic kidney disease: Secondary | ICD-10-CM | POA: Diagnosis present

## 2017-02-06 DIAGNOSIS — I5030 Unspecified diastolic (congestive) heart failure: Secondary | ICD-10-CM | POA: Diagnosis not present

## 2017-02-06 DIAGNOSIS — G40209 Localization-related (focal) (partial) symptomatic epilepsy and epileptic syndromes with complex partial seizures, not intractable, without status epilepticus: Secondary | ICD-10-CM

## 2017-02-06 DIAGNOSIS — J810 Acute pulmonary edema: Secondary | ICD-10-CM | POA: Diagnosis not present

## 2017-02-06 DIAGNOSIS — R414 Neurologic neglect syndrome: Secondary | ICD-10-CM | POA: Diagnosis not present

## 2017-02-06 DIAGNOSIS — J189 Pneumonia, unspecified organism: Secondary | ICD-10-CM | POA: Diagnosis not present

## 2017-02-06 DIAGNOSIS — Z4682 Encounter for fitting and adjustment of non-vascular catheter: Secondary | ICD-10-CM | POA: Diagnosis not present

## 2017-02-06 DIAGNOSIS — Z515 Encounter for palliative care: Secondary | ICD-10-CM | POA: Diagnosis not present

## 2017-02-06 DIAGNOSIS — R778 Other specified abnormalities of plasma proteins: Secondary | ICD-10-CM

## 2017-02-06 DIAGNOSIS — Z7984 Long term (current) use of oral hypoglycemic drugs: Secondary | ICD-10-CM

## 2017-02-06 DIAGNOSIS — G8929 Other chronic pain: Secondary | ICD-10-CM | POA: Diagnosis present

## 2017-02-06 DIAGNOSIS — I6310 Cerebral infarction due to embolism of unspecified precerebral artery: Secondary | ICD-10-CM | POA: Diagnosis not present

## 2017-02-06 DIAGNOSIS — G8194 Hemiplegia, unspecified affecting left nondominant side: Secondary | ICD-10-CM | POA: Diagnosis not present

## 2017-02-06 DIAGNOSIS — R1313 Dysphagia, pharyngeal phase: Secondary | ICD-10-CM

## 2017-02-06 DIAGNOSIS — R748 Abnormal levels of other serum enzymes: Secondary | ICD-10-CM | POA: Diagnosis not present

## 2017-02-06 DIAGNOSIS — R7989 Other specified abnormal findings of blood chemistry: Secondary | ICD-10-CM | POA: Diagnosis not present

## 2017-02-06 DIAGNOSIS — R401 Stupor: Secondary | ICD-10-CM | POA: Diagnosis not present

## 2017-02-06 DIAGNOSIS — N183 Chronic kidney disease, stage 3 (moderate): Secondary | ICD-10-CM | POA: Diagnosis present

## 2017-02-06 DIAGNOSIS — F1020 Alcohol dependence, uncomplicated: Secondary | ICD-10-CM | POA: Diagnosis not present

## 2017-02-06 DIAGNOSIS — I480 Paroxysmal atrial fibrillation: Secondary | ICD-10-CM | POA: Diagnosis not present

## 2017-02-06 DIAGNOSIS — E118 Type 2 diabetes mellitus with unspecified complications: Secondary | ICD-10-CM | POA: Diagnosis not present

## 2017-02-06 DIAGNOSIS — Z79899 Other long term (current) drug therapy: Secondary | ICD-10-CM

## 2017-02-06 DIAGNOSIS — E1122 Type 2 diabetes mellitus with diabetic chronic kidney disease: Secondary | ICD-10-CM | POA: Diagnosis present

## 2017-02-06 DIAGNOSIS — R0602 Shortness of breath: Secondary | ICD-10-CM | POA: Diagnosis not present

## 2017-02-06 DIAGNOSIS — H547 Unspecified visual loss: Secondary | ICD-10-CM | POA: Diagnosis not present

## 2017-02-06 DIAGNOSIS — E440 Moderate protein-calorie malnutrition: Secondary | ICD-10-CM | POA: Insufficient documentation

## 2017-02-06 DIAGNOSIS — I4891 Unspecified atrial fibrillation: Secondary | ICD-10-CM

## 2017-02-06 DIAGNOSIS — R29898 Other symptoms and signs involving the musculoskeletal system: Secondary | ICD-10-CM | POA: Diagnosis not present

## 2017-02-06 DIAGNOSIS — J9601 Acute respiratory failure with hypoxia: Secondary | ICD-10-CM | POA: Diagnosis not present

## 2017-02-06 DIAGNOSIS — R569 Unspecified convulsions: Secondary | ICD-10-CM

## 2017-02-06 DIAGNOSIS — I25110 Atherosclerotic heart disease of native coronary artery with unstable angina pectoris: Secondary | ICD-10-CM | POA: Diagnosis not present

## 2017-02-06 DIAGNOSIS — Z9114 Patient's other noncompliance with medication regimen: Secondary | ICD-10-CM

## 2017-02-06 DIAGNOSIS — I638 Other cerebral infarction: Secondary | ICD-10-CM | POA: Diagnosis not present

## 2017-02-06 DIAGNOSIS — I214 Non-ST elevation (NSTEMI) myocardial infarction: Principal | ICD-10-CM | POA: Diagnosis present

## 2017-02-06 DIAGNOSIS — Z6827 Body mass index (BMI) 27.0-27.9, adult: Secondary | ICD-10-CM | POA: Diagnosis not present

## 2017-02-06 DIAGNOSIS — R1310 Dysphagia, unspecified: Secondary | ICD-10-CM

## 2017-02-06 DIAGNOSIS — K224 Dyskinesia of esophagus: Secondary | ICD-10-CM | POA: Diagnosis present

## 2017-02-06 DIAGNOSIS — I10 Essential (primary) hypertension: Secondary | ICD-10-CM | POA: Diagnosis not present

## 2017-02-06 DIAGNOSIS — J988 Other specified respiratory disorders: Secondary | ICD-10-CM | POA: Diagnosis not present

## 2017-02-06 DIAGNOSIS — I471 Supraventricular tachycardia: Secondary | ICD-10-CM | POA: Diagnosis not present

## 2017-02-06 DIAGNOSIS — G9349 Other encephalopathy: Secondary | ICD-10-CM | POA: Diagnosis not present

## 2017-02-06 DIAGNOSIS — R29810 Facial weakness: Secondary | ICD-10-CM | POA: Diagnosis not present

## 2017-02-06 DIAGNOSIS — I208 Other forms of angina pectoris: Secondary | ICD-10-CM | POA: Diagnosis not present

## 2017-02-06 DIAGNOSIS — I6340 Cerebral infarction due to embolism of unspecified cerebral artery: Secondary | ICD-10-CM | POA: Insufficient documentation

## 2017-02-06 DIAGNOSIS — I679 Cerebrovascular disease, unspecified: Secondary | ICD-10-CM | POA: Diagnosis not present

## 2017-02-06 DIAGNOSIS — R4702 Dysphasia: Secondary | ICD-10-CM | POA: Diagnosis present

## 2017-02-06 DIAGNOSIS — R402434 Glasgow coma scale score 3-8, 24 hours or more after hospital admission: Secondary | ICD-10-CM | POA: Diagnosis not present

## 2017-02-06 DIAGNOSIS — J984 Other disorders of lung: Secondary | ICD-10-CM | POA: Diagnosis not present

## 2017-02-06 DIAGNOSIS — R4781 Slurred speech: Secondary | ICD-10-CM | POA: Diagnosis not present

## 2017-02-06 LAB — HEPATIC FUNCTION PANEL
ALBUMIN: 2.7 g/dL — AB (ref 3.5–5.0)
ALT: 29 U/L (ref 17–63)
AST: 35 U/L (ref 15–41)
Alkaline Phosphatase: 128 U/L — ABNORMAL HIGH (ref 38–126)
Bilirubin, Direct: 0.2 mg/dL (ref 0.1–0.5)
Indirect Bilirubin: 0.8 mg/dL (ref 0.3–0.9)
TOTAL PROTEIN: 5.9 g/dL — AB (ref 6.5–8.1)
Total Bilirubin: 1 mg/dL (ref 0.3–1.2)

## 2017-02-06 LAB — LIPID PANEL
CHOL/HDL RATIO: 3.2 ratio
Cholesterol: 190 mg/dL (ref 0–200)
HDL: 60 mg/dL (ref >40)
LDL Cholesterol: 112 mg/dL — ABNORMAL HIGH (ref 0–99)
TRIGLYCERIDES: 88 mg/dL (ref <150)
VLDL: 18 mg/dL (ref 0–40)

## 2017-02-06 LAB — BASIC METABOLIC PANEL
Anion gap: 7 (ref 5–15)
BUN: 47 mg/dL — AB (ref 6–20)
CHLORIDE: 94 mmol/L — AB (ref 101–111)
CO2: 28 mmol/L (ref 22–32)
CREATININE: 1.88 mg/dL — AB (ref 0.61–1.24)
Calcium: 9.4 mg/dL (ref 8.9–10.3)
GFR, EST AFRICAN AMERICAN: 38 mL/min — AB (ref >60)
GFR, EST NON AFRICAN AMERICAN: 33 mL/min — AB (ref >60)
Glucose, Bld: 113 mg/dL — ABNORMAL HIGH (ref 65–99)
Potassium: 4.1 mmol/L (ref 3.5–5.1)
SODIUM: 129 mmol/L — AB (ref 135–145)

## 2017-02-06 LAB — CBC
HCT: 34.9 % — ABNORMAL LOW (ref 39.0–52.0)
Hemoglobin: 11.9 g/dL — ABNORMAL LOW (ref 13.0–17.0)
MCH: 31.1 pg (ref 26.0–34.0)
MCHC: 34.1 g/dL (ref 30.0–36.0)
MCV: 91.1 fL (ref 78.0–100.0)
PLATELETS: 210 10*3/uL (ref 150–400)
RBC: 3.83 MIL/uL — AB (ref 4.22–5.81)
RDW: 12.6 % (ref 11.5–15.5)
WBC: 14.9 10*3/uL — AB (ref 4.0–10.5)

## 2017-02-06 LAB — URINALYSIS, ROUTINE W REFLEX MICROSCOPIC
BACTERIA UA: NONE SEEN
Bilirubin Urine: NEGATIVE
Glucose, UA: NEGATIVE mg/dL
Hgb urine dipstick: NEGATIVE
KETONES UR: NEGATIVE mg/dL
LEUKOCYTES UA: NEGATIVE
Nitrite: NEGATIVE
PH: 6 (ref 5.0–8.0)
Specific Gravity, Urine: 1.013 (ref 1.005–1.030)
Squamous Epithelial / HPF: NONE SEEN

## 2017-02-06 LAB — I-STAT TROPONIN, ED: TROPONIN I, POC: 0.89 ng/mL — AB (ref 0.00–0.08)

## 2017-02-06 LAB — BRAIN NATRIURETIC PEPTIDE: B Natriuretic Peptide: 387.7 pg/mL — ABNORMAL HIGH (ref 0.0–100.0)

## 2017-02-06 LAB — TSH: TSH: 1.798 u[IU]/mL (ref 0.350–4.500)

## 2017-02-06 LAB — LIPASE, BLOOD: LIPASE: 41 U/L (ref 11–51)

## 2017-02-06 LAB — HEPARIN LEVEL (UNFRACTIONATED): HEPARIN UNFRACTIONATED: 0.33 [IU]/mL (ref 0.30–0.70)

## 2017-02-06 MED ORDER — ASPIRIN 81 MG PO CHEW
324.0000 mg | CHEWABLE_TABLET | Freq: Once | ORAL | Status: AC
  Administered 2017-02-06: 324 mg via ORAL
  Filled 2017-02-06: qty 4

## 2017-02-06 MED ORDER — DORZOLAMIDE HCL 2 % OP SOLN
1.0000 [drp] | Freq: Two times a day (BID) | OPHTHALMIC | Status: DC
  Administered 2017-02-06 – 2017-02-12 (×11): 1 [drp] via OPHTHALMIC
  Filled 2017-02-06 (×2): qty 10

## 2017-02-06 MED ORDER — ATORVASTATIN CALCIUM 80 MG PO TABS
80.0000 mg | ORAL_TABLET | Freq: Every day | ORAL | Status: DC
  Administered 2017-02-06 – 2017-02-12 (×5): 80 mg via ORAL
  Filled 2017-02-06 (×5): qty 1

## 2017-02-06 MED ORDER — ONDANSETRON HCL 4 MG/2ML IJ SOLN: 4.0000 mg | Freq: Four times a day (QID) | INTRAMUSCULAR | Status: DC | PRN

## 2017-02-06 MED ORDER — SODIUM CHLORIDE 0.9 % IV SOLN: INTRAVENOUS | Status: DC

## 2017-02-06 MED ORDER — ATORVASTATIN CALCIUM 40 MG PO TABS: 40.0000 mg | ORAL_TABLET | Freq: Every day | ORAL | Status: DC

## 2017-02-06 MED ORDER — NITROGLYCERIN 0.4 MG SL SUBL: 0.4000 mg | SUBLINGUAL_TABLET | SUBLINGUAL | Status: DC | PRN

## 2017-02-06 MED ORDER — HEPARIN (PORCINE) IN NACL 100-0.45 UNIT/ML-% IJ SOLN
1150.0000 [IU]/h | INTRAMUSCULAR | Status: DC
  Administered 2017-02-06: 1000 [IU]/h via INTRAVENOUS
  Administered 2017-02-07: 1150 [IU]/h via INTRAVENOUS
  Filled 2017-02-06 (×2): qty 250

## 2017-02-06 MED ORDER — HEPARIN BOLUS VIA INFUSION
4000.0000 [IU] | Freq: Once | INTRAVENOUS | Status: AC
Start: 2017-02-06 — End: 2017-02-06
  Administered 2017-02-06: 4000 [IU] via INTRAVENOUS
  Filled 2017-02-06: qty 4000

## 2017-02-06 MED ORDER — NEBIVOLOL HCL 5 MG PO TABS: 5.0000 mg | ORAL_TABLET | Freq: Every day | ORAL | Status: DC

## 2017-02-06 MED ORDER — TRAZODONE HCL 50 MG PO TABS
50.0000 mg | ORAL_TABLET | Freq: Every day | ORAL | Status: DC
  Administered 2017-02-06 – 2017-02-10 (×3): 50 mg via ORAL
  Filled 2017-02-06 (×5): qty 1

## 2017-02-06 MED ORDER — INSULIN ASPART 100 UNIT/ML ~~LOC~~ SOLN
0.0000 [IU] | SUBCUTANEOUS | Status: DC
  Administered 2017-02-09: 3 [IU] via SUBCUTANEOUS
  Administered 2017-02-09 (×3): 1 [IU] via SUBCUTANEOUS
  Administered 2017-02-10 (×2): 2 [IU] via SUBCUTANEOUS
  Administered 2017-02-11: 3 [IU] via SUBCUTANEOUS
  Administered 2017-02-11: 2 [IU] via SUBCUTANEOUS
  Administered 2017-02-11: 3 [IU] via SUBCUTANEOUS

## 2017-02-06 MED ORDER — ASPIRIN EC 81 MG PO TBEC
81.0000 mg | DELAYED_RELEASE_TABLET | Freq: Every day | ORAL | Status: DC
  Administered 2017-02-07 – 2017-02-12 (×3): 81 mg via ORAL
  Filled 2017-02-06 (×3): qty 1

## 2017-02-06 MED ORDER — SODIUM CHLORIDE 0.9 % IV SOLN
INTRAVENOUS | Status: DC
  Administered 2017-02-06 – 2017-02-08 (×3): via INTRAVENOUS

## 2017-02-06 MED ORDER — LATANOPROST 0.005 % OP SOLN
1.0000 [drp] | Freq: Every day | OPHTHALMIC | Status: DC
  Administered 2017-02-06 – 2017-02-12 (×8): 1 [drp] via OPHTHALMIC
  Filled 2017-02-06 (×3): qty 2.5

## 2017-02-06 MED ORDER — ACETAMINOPHEN 325 MG PO TABS
650.0000 mg | ORAL_TABLET | ORAL | Status: DC | PRN
  Administered 2017-02-10 – 2017-02-12 (×2): 650 mg via ORAL
  Filled 2017-02-06 (×2): qty 2

## 2017-02-06 NOTE — ED Triage Notes (Signed)
Pt c/o feeling fatigue and having generalized weakness ongoing for "Awhile." Pt reports seen by PMD for same but not getting any answers. Pt also c/o burning sensation in the middle of his chest when he eats.

## 2017-02-06 NOTE — Progress Notes (Signed)
ANTICOAGULATION CONSULT NOTE - Initial Consult  Pharmacy Consult for Heparin Indication: chest pain/ACS  No Known Allergies  Patient Measurements: Height: 5\' 8"  (172.7 cm) Weight: 178 lb (80.7 kg) IBW/kg (Calculated) : 68.4 Heparin Dosing Weight: 80.7 kg  Vital Signs: BP: 164/78 (04/15 1247) Pulse Rate: 46 (04/15 1247)  Labs:  Recent Labs  02/06/17 1207  HGB 11.9*  HCT 34.9*  PLT 210  CREATININE 1.88*    Estimated Creatinine Clearance: 31.8 mL/min (A) (by C-G formula based on SCr of 1.88 mg/dL (H)).   Medical History: Past Medical History:  Diagnosis Date  . Chronic kidney disease   . Hypertension     Assessment: 76 year old male in ED with ACS on IV heparin. Troponin elevated at 0.89. ASA given. Cardiology consult pending.  Discussed therapy and risks with patient and son and patient is in agreement to start IV heparin.   Hgb down some at 11.9. Platelets are within normal limits.  No bleeding noted.  Patient has known CKD. SCr is elevated at 1.88 (maybe patient's baseline as last SCr 09/2016 was 1.73). Patient was not on anticoagulation prior to admission.   Goal of Therapy:  Heparin level 0.3-0.7 units/ml Monitor platelets by anticoagulation protocol: Yes   Plan:  Give 4000  units bolus x 1 Start heparin infusion at 1000 units/hr Check anti-Xa level in 8 hours and daily while on heparin Continue to monitor H&H and platelets  Sloan Leiter, PharmD, BCPS Clinical Pharmacist Clinical phone 02/06/2017 until 3:30 PM - #[phone] After hours, please call [phone] 02/06/2017,1:17 PM

## 2017-02-06 NOTE — Progress Notes (Addendum)
Pt requesting something for nerves, questioned if he takes anything at home and pt response" NO, But ill take a vodka and sprite if you got it" . Patients daughter followed me into [facility] and stated that patient drinks Vodka daily. I asked how much and she states " we are not really sure, but probably 1/5 Vodka daily". Md on call paged and informed. CIWA initiated currently "2" due to c/o anxiety. Will continue to monitor. Jessie Foot, RN

## 2017-02-06 NOTE — ED Provider Notes (Signed)
[facility] DEPT Provider Note   CSN: [id number] Arrival date & time: 02/06/17  1131     History   Chief Complaint Chief Complaint  Patient presents with  . Fatigue  . Weakness  . Chest Pain    HPI Charles Stump. is a 76 y.o. male.  HPI Charles Kann. is a 76 y.o. male with history of hypertension, presents to emergency department complaining of generalized weakness, fatigue, and pain with swallowing. Patient states he has been feeling weak over the last 10 days. He states he is not walking as much due to fatigue. He also complaining of some pain in his feet. He states he went to a podiatrist 3 days ago and received an injection of steroids and his heel that has not helped his pain. Patient also is complaining of burning sensation in the center of the chest that is worse when swallowing. He states sometimes he feels like when he eats the food gets stuck in his chest and sometimes he ends up vomiting food back out. He denies any exertional chest pain or shortness of breath. He denies any cough or congestion. No recent illnesses. Denies any prior heart or lung problems. He states that several weeks ago he was started on Bystolic for blood pressure and that is the only new medication that he remembers.  Past Medical History:  Diagnosis Date  . Chronic kidney disease   . Hypertension     There are no active problems to display for this patient.   Past Surgical History:  Procedure Laterality Date  . fatty tissue removed from back of head    . NO PAST SURGERIES         Home Medications    Prior to Admission medications   Medication Sig Start Date End Date Taking? Authorizing Provider  aspirin 81 MG tablet Take 81 mg by mouth daily.    Historical Provider, MD  Cholecalciferol (VITAMIN D3) 2000 units TABS Take 2 tablets by mouth daily.    Historical Provider, MD  DOCOSAHEXAENOIC ACID PO Take 1 g by mouth.    Historical Provider, MD  dorzolamide (TRUSOPT) 2 %  ophthalmic solution Place 1 drop into both eyes 2 (two) times daily.    Historical Provider, MD  furosemide (LASIX) 20 MG tablet  11/15/16   Historical Provider, MD  glimepiride (AMARYL) 2 MG tablet  12/09/15   Historical Provider, MD  latanoprost (XALATAN) 0.005 % ophthalmic solution Place 1 drop into both eyes at bedtime.    Historical Provider, MD  losartan-hydrochlorothiazide (HYZAAR) 100-25 MG tablet Take 1 tablet by mouth daily.    Historical Provider, MD  meloxicam (MOBIC) 15 MG tablet  01/28/17   Historical Provider, MD  nebivolol (BYSTOLIC) 10 MG tablet Take 10 mg by mouth daily.    Historical Provider, MD  Omega-3 Fatty Acids (FISH OIL OMEGA-3 PO) Take 1,600 mg elemental calcium/kg/hr by mouth daily.    Historical Provider, MD  simvastatin (ZOCOR) 20 MG tablet Take 20 mg by mouth daily.    Historical Provider, MD    Family History Family History  Problem Relation Age of Onset  . Cancer - Lung Mother   . Kidney disease Father   . Heart failure Father     Social History Social History  Substance Use Topics  . Smoking status: Former Smoker    Packs/day: 2.00    Years: 15.00    Types: Cigarettes, Cigars    Quit date: 09/27/2010  . Smokeless tobacco: Former Systems developer  Types: Sarina Ser    Quit date: 09/27/1989  . Alcohol use 1.2 oz/week    2 Shots of liquor per week     Allergies   Patient has no known allergies.   Review of Systems Review of Systems  Constitutional: Positive for fatigue. Negative for chills and fever.  Respiratory: Negative for cough, chest tightness and shortness of breath.   Cardiovascular: Positive for chest pain. Negative for palpitations and leg swelling.  Gastrointestinal: Positive for vomiting. Negative for abdominal distention, abdominal pain, diarrhea and nausea.  Genitourinary: Negative for dysuria, frequency, hematuria and urgency.  Musculoskeletal: Negative for arthralgias, myalgias, neck pain and neck stiffness.  Skin: Negative for rash.    Allergic/Immunologic: Negative for immunocompromised state.  Neurological: Positive for weakness. Negative for dizziness, light-headedness, numbness and headaches.  All other systems reviewed and are negative.    Physical Exam Updated Vital Signs Pulse (!) 53   Resp 19   Ht 5\' 8"  (1.727 m)   Wt 80.7 kg   SpO2 98%   BMI 27.06 kg/m   Physical Exam  Constitutional: He is oriented to person, place, and time. He appears well-developed and well-nourished. No distress.  HENT:  Head: Normocephalic and atraumatic.  Eyes: Conjunctivae are normal.  Neck: Neck supple.  Cardiovascular: Normal rate, regular rhythm and normal heart sounds.   Pulmonary/Chest: Effort normal. No respiratory distress. He has no wheezes. He has no rales.  Abdominal: Soft. Bowel sounds are normal. He exhibits no distension. There is no tenderness. There is no rebound and no guarding.  Musculoskeletal: He exhibits no edema.  Neurological: He is alert and oriented to person, place, and time.  5/5 and equal upper and lower extremity strength bilaterally. Equal grip strength bilaterally. Normal finger to nose and heel to shin. No pronator drift.   Skin: Skin is warm and dry. Capillary refill takes less than 2 seconds.  Nursing note and vitals reviewed.    ED Treatments / Results  Labs (all labs ordered are listed, but only abnormal results are displayed) Labs Reviewed  CBC - Abnormal; Notable for the following:       Result Value   WBC 14.9 (*)    RBC 3.83 (*)    Hemoglobin 11.9 (*)    HCT 34.9 (*)    All other components within normal limits  I-STAT TROPOININ, ED - Abnormal; Notable for the following:    Troponin i, poc 0.89 (*)    All other components within normal limits  BASIC METABOLIC PANEL  TSH  URINALYSIS, ROUTINE W REFLEX MICROSCOPIC    EKG  EKG Interpretation  Date/Time:  Sunday February 06 2017 11:58:47 EDT Ventricular Rate:  67 PR Interval:  140 QRS Duration: 78 QT Interval:  404 QTC  Calculation: 426 R Axis:   10 Text Interpretation:  Normal sinus rhythm Minimal voltage criteria for LVH, may be normal variant Borderline ECG Confirmed by RAY MD, Andee Poles [phone]) on 02/06/2017 12:44:58 PM       Radiology Dg Chest 2 View  Result Date: 02/06/2017 CLINICAL DATA:  Pt c/o feeling fatigue and having generalized weakness ongoing for "a while." Pt reports seen by PMD for same but not getting any answers. Pt also c/o burning sensation in the middle of his chest when he eats. EXAM: CHEST  2 VIEW COMPARISON:  None. FINDINGS: Normal mediastinum and cardiac silhouette. Normal pulmonary vasculature. No evidence of effusion, infiltrate, or pneumothorax. No acute bony abnormality. Degenerative osteophytosis of the spine. IMPRESSION: No acute cardiopulmonary process. Electronically  Signed   By: Suzy Bouchard M.D.   On: 02/06/2017 13:25    Procedures Procedures (including critical care time)  Medications Ordered in ED Medications  aspirin chewable tablet 324 mg (not administered)     Initial Impression / Assessment and Plan / ED Course  I have reviewed the triage vital signs and the nursing notes.  Pertinent labs & imaging results that were available during my care of the patient were reviewed by me and considered in my medical decision making (see chart for details).    Patient in emergency department generalized weakness, intermittent chest pain that he describes as burning in the center of the chest and feels like food getting stuck when swallowing. Patient is in no acute distress at this time. He is hypertensive, also states just started on new blood pressure medications just prior to onset of his weakness, Bystolic, could be one of the causes of his weakness. Will check labs, troponin, chest x-ray.  1:19 PM Troponin is 0.89. Patient denies any prior cardiac issues or any prior stress tests. I will give him aspirin and start him on heparin. Will call cardiology. He currently  denies any chest pain.  Spoke with cardiology, asked for medicine to admit. I discussed pt with family practice who will admit him. Discussed with pt and family who agreed to admission.   Vitals:   02/06/17 1456 02/06/17 1457 02/06/17 1458 02/06/17 1500  BP:    (!) 173/68  Pulse: (!) 46 (!) 48 (!) 47 (!) 45  Resp: 18 17 14 14   SpO2: 98% 94% 97% 99%  Weight:      Height:          Final Clinical Impressions(s) / ED Diagnoses   Final diagnoses:  Generalized weakness  Atypical chest pain  Elevated troponin  Secondary hypertension    New Prescriptions New Prescriptions   No medications on file     Jeannett Senior, PA-C 02/06/17 1602    Pattricia Boss, MD 02/10/17 [phone]

## 2017-02-06 NOTE — Progress Notes (Signed)
ANTICOAGULATION CONSULT NOTE - Follow Up Consult  Pharmacy Consult for Heparin  Indication: chest pain/ACS  No Known Allergies  Patient Measurements: Height: 5\' 8"  (172.7 cm) Weight: 165 lb 8 oz (75.1 kg) IBW/kg (Calculated) : 68.4  Vital Signs: Temp: 99.1 F (37.3 C) (04/15 2030) Temp Source: Oral (04/15 2030) BP: 183/72 (04/15 2030) Pulse Rate: 54 (04/15 2030)  Labs:  Recent Labs  02/06/17 1207 02/06/17 2048  HGB 11.9*  --   HCT 34.9*  --   PLT 210  --   HEPARINUNFRC  --  0.33  CREATININE 1.88*  --     Estimated Creatinine Clearance: 31.8 mL/min (A) (by C-G formula based on SCr of 1.88 mg/dL (H)).   Assessment: 76 y/o M on heparin for CP, possible cath tomorrow depending on renal function, initial heparin level is just above therapeutic goal at 0.33  Goal of Therapy:  Heparin level 0.3-0.7 units/ml Monitor platelets by anticoagulation protocol: Yes   Plan:  -Inc heparin to 1050 units/hr  -HL with AM labs  Narda Bonds 02/06/2017,9:48 PM

## 2017-02-06 NOTE — H&P (Signed)
[facility] Admission History and Physical Service Pager: [phone]  Patient name: Charles Hanson. Medical record number: [id number] Date of birth: [birth year] Age: 76 y.o. Gender: male  Primary Care Provider: [place] Consultants: Cardiology  Code Status: Full (confirmed on admission)  Chief Complaint: Generalized weakness and chest pain  Assessment and Plan: [name]. is a 76 y.o. male presenting with generalized weakness and chest pain. PMH is significant for HTN, HLD, CKDIII, NIDDM.  #Atypical chest pain  Inferior-NSTEMI  Bradycardia: Acute. HEART score 8. Initial troponin 0.89. EKG concerning for inferior lead changes. Sinus bradycardia and symptoms support this finding (would anticipate improvement of this over the next 24 hrs). CXR unremarkable. Cardiology consulted in ED. Patient started on heparin gtt for concerning active NSTEMI. Patient has 20-pack-year history. No history of catheterizations.  --Admit to inpatient on telemetry, attending Dr. Andria Frames --Cardiology consulted, appreciate recommendations: Trending troponin and EKG overnight, continue ASA and statin with heparin gtt, avoid ACE-I and lower home by bystolic to 5 mg, plan for cath tomorrow, NPO at MN --Heparin gtt --ASA 81 mg QD, nitro PRN, Zofran PRN, O2 therapy to keep sat equal >92% --Lipitor 80 mg QD --Echocardiogram pending --EKG in AM --Monitor BP and HR --Cardiac monitoring, continuous pulse ox  #Hypertension  Hyperlipidemia: Chronic. Stable. BP at 160s/80s. On Lasix, Bystolic, and losartan-hydrochlorothiazide at home. On statin therapy. --Bradycardia per above, holding antihypertensives given stability --Initiating Lipitor 80 mg QD, will likely need high-intensity statin on discharge --ASA 81 mg QD --Lipid panel  #Acute kidney injury  Chronic kidney disease stage III: Chronic. Stable. Creatinine 1.88 on admission. Baseline 1.73 per chart review.  Creatinine clearance 32 mL/min. --Holding antihypertensives above given AKI --IVF NS@75mL /hr for x10 hrs --Avoid nephrotoxic agents --Daily BMET  #Non-insulin-dependent diabetes mellitus: Chronic. Stable. On glimepiride at home. She only completed dose of prednisone for plantar fasciitis. CBGs remain controlled at 100s. --Sensitive sliding scale while on diet until MN, holding basal/bolus insulin --Holding glimepiride 2 mg QD --A1c pending  FEN/GI: HH/carb modified diet, NPO at MN except sips meds Prophylaxis: Heparin gtt  Disposition: ACS workup with pending intervention 4/16.  History of Present Illness:  Charles Hanson. is a 76 y.o. male presenting with generalized weakness and chest pain. PMH is significant for HTN, HLD, CKDIII, NIDDM.  Patient endorses 3 weeks of progressive generalized weakness. In addition, patient has been experiencing intermittent burning in his central left chest for the past 10 days. He states sometimes he feels like food is coming up. He denies shortness of breath, diaphoresis, palpitations, nausea during these episodes. Chest pain not relieved with rest and is not exacerbated with exertion. Patient was brought by son due to worsening symptoms. Former smoker of 20 pack years with EtOH use ~7-10 drinks per week. No previous history of catheterizations. Father had heart disease. Patient formally taking aspirin daily, however discontinued 3 months ago due to epistaxis.  Upon admission to [facility] ED, VS pertinent for bradycardia 40s-50s, hypertension 160/70's, respiration 17 with 100% on RA. Pertinent labs: I-STAT troponin 0.9, BNP 387.7, TSH 1.790 (WNL), WBC 14.9, hgb 11.9, Na 129, K 4.1, glucose 113, Cr 1.88, lipase 41. Initial EKG consistent with NSR w/o obvious ST changes or t-wave abn however cards reviewed and suspect inferior MI. CXR negative. Patient was placed on heparin gtt for suspected NSTEMI involving inferior leads. Patient admitted to observation with family  practice teaching service for further management.  Review Of Systems: Per HPI with the following  additions: See above for pertinent. Otherwise the remainder of the systems were negative.  Review of Systems  Constitutional: Negative for chills and fever.  HENT: Negative for congestion and sore throat.   Eyes: Negative for blurred vision and double vision.  Respiratory: Negative for cough and shortness of breath.   Cardiovascular: Positive for chest pain. Negative for palpitations, orthopnea, leg swelling and PND.  Gastrointestinal: Negative for abdominal pain, blood in stool, nausea and vomiting.  Genitourinary: Negative for dysuria and urgency.  Musculoskeletal: Negative for back pain and neck pain.  Skin: Negative for itching and rash.  Neurological: Negative for focal weakness and headaches.    There are no active problems to display for this patient.   Past Medical History: Past Medical History:  Diagnosis Date  . Chronic kidney disease   . Hypertension     Past Surgical History: Past Surgical History:  Procedure Laterality Date  . fatty tissue removed from back of head    . NO PAST SURGERIES      Social History: Social History  Substance Use Topics  . Smoking status: Former Smoker    Packs/day: 2.00    Years: 15.00    Types: Cigarettes, Cigars    Quit date: 09/27/2010  . Smokeless tobacco: Former Systems developer    Types: Chew    Quit date: 09/27/1989  . Alcohol use 1.2 oz/week    2 Shots of liquor per week   Additional social history: Former smoker, 20 pack years. Alcohol use 7-10 drinks per week. Please also refer to relevant sections of EMR.  Family History: Family History  Problem Relation Age of Onset  . Cancer - Lung Mother   . Kidney disease Father   . Heart failure Father     Allergies and Medications: No Known Allergies No current facility-administered medications on file prior to encounter.    Current Outpatient Prescriptions on File Prior to Encounter   Medication Sig Dispense Refill  . aspirin 81 MG tablet Take 81 mg by mouth daily.    . Cholecalciferol (VITAMIN D3) 2000 units TABS Take 2 tablets by mouth daily.    . DOCOSAHEXAENOIC ACID PO Take 1 g by mouth.    . dorzolamide (TRUSOPT) 2 % ophthalmic solution Place 1 drop into both eyes 2 (two) times daily.    . furosemide (LASIX) 20 MG tablet     . glimepiride (AMARYL) 2 MG tablet     . latanoprost (XALATAN) 0.005 % ophthalmic solution Place 1 drop into both eyes at bedtime.    Marland Kitchen losartan-hydrochlorothiazide (HYZAAR) 100-25 MG tablet Take 1 tablet by mouth daily.    . meloxicam (MOBIC) 15 MG tablet     . nebivolol (BYSTOLIC) 10 MG tablet Take 10 mg by mouth daily.    . Omega-3 Fatty Acids (FISH OIL OMEGA-3 PO) Take 1,600 mg elemental calcium/kg/hr by mouth daily.    . simvastatin (ZOCOR) 20 MG tablet Take 20 mg by mouth daily.      Objective: BP (!) 194/73   Pulse (!) 46   Resp 17   Ht 5\' 8"  (1.727 m)   Wt 178 lb (80.7 kg)   SpO2 98%   BMI 27.06 kg/m  Exam: General: elderly male, well nourished, well developed, in no acute distress with non-toxic appearance HEENT: normocephalic, atraumatic, moist mucous membranes Neck: supple, non-tender without lymphadenopathy CV: bradycardic with regular rhythm without murmurs, rubs, or gallops Lungs: clear to auscultation bilaterally with normal work of breathing on room air Abdomen: soft, non-tender,  no masses or organomegaly palpable, normoactive bowel sounds Skin: warm, dry, no rashes or lesions, cap refill < 2 seconds Extremities: warm and well perfused, normal tone  Labs and Imaging: CBC BMET   Recent Labs Lab 02/06/17 1207  WBC 14.9*  HGB 11.9*  HCT 34.9*  PLT 210    Recent Labs Lab 02/06/17 1207  NA 129*  K 4.1  CL 94*  CO2 28  BUN 47*  CREATININE 1.88*  GLUCOSE 113*  CALCIUM 9.4     I-STAT troponin: 0.89 UA: Protein 300 TSH: 1.798 (WNL) BNP: 387.7 (H) Hepatic panel: Alkaline phosphatase 128 (H), albumin  2.7 (L), total protein 5.9 (L) Lipase: 41 (WNL)  DG Chest 2 View (02/06/2017) FINDINGS: Normal mediastinum and cardiac silhouette. Normal pulmonary vasculature. No evidence of effusion, infiltrate, or pneumothorax. No acute bony abnormality. Degenerative osteophytosis of the spine.  IMPRESSION: No acute cardiopulmonary process.  Grayling Bing, DO 02/06/2017, 2:08 PM PGY-1, [facility] Intern pager: [phone], text pages welcome   Upper Level Addendum:  I have seen and evaluated this patient along with Dr. Yisroel Ramming and reviewed the above note, making necessary revisions in red.   Elberta Leatherwood, MD,MS,  PGY3 02/06/2017 8:05 PM

## 2017-02-06 NOTE — Consult Note (Addendum)
Primary cardiologist: n/a Consulting cardiologist: Dr Carlyle Dolly  Requesting physician: Dr Jeanell Sparrow Indication: chest pain  Clinical Summary Charles Hanson is a 76 y.o.male history of CKD3, nephrotic syndrome, HTN but no known cardiac history presents with chest pain. He reports over the last month having intermittent sharp pain in midcheset 5/10 in severity. Can occur at rest or with acitivity. Not positional. No other associated symptoms. Can las several hours, sometimes up to 12 hours constantly. Recent issues with dysphagia, food gets stuck and often comes back up. No significant DOE, though sedentary lifestyle due to chronic pain in his feet    Na 129 Cr 1.88 (up from 1.73) Hgb 11.9 Plt 210 WBC 14.9  Trop 0.89 CXR no acute process EKG SR, infeiror TWIs  No Known Allergies  Medications Scheduled Medications:    Infusions:    PRN Medications:     Past Medical History:  Diagnosis Date  . Chronic kidney disease   . Hypertension     Past Surgical History:  Procedure Laterality Date  . fatty tissue removed from back of head    . NO PAST SURGERIES      Family History  Problem Relation Age of Onset  . Cancer - Lung Mother   . Kidney disease Father   . Heart failure Father     Social History Charles Hanson reports that he quit smoking about 6 years ago. His smoking use included Cigarettes and Cigars. He has a 30.00 pack-year smoking history. He quit smokeless tobacco use about 27 years ago. His smokeless tobacco use included Chew. Charles Hanson reports that he drinks about 1.2 oz of alcohol per week .  Review of Systems CONSTITUTIONAL: No weight loss, fever, chills, weakness or fatigue.  HEENT: Eyes: No visual loss, blurred vision, double vision or yellow sclerae. No hearing loss, sneezing, congestion, runny nose or sore throat.  SKIN: No rash or itching.  CARDIOVASCULAR: per HP RESPIRATORY: No shortness of breath, cough or sputum.  GASTROINTESTINAL: No anorexia,  nausea, vomiting or diarrhea. No abdominal pain or blood.  GENITOURINARY: no polyuria, no dysuria NEUROLOGICAL: No headache, dizziness, syncope, paralysis, ataxia, numbness or tingling in the extremities. No change in bowel or bladder control.  MUSCULOSKELETAL: No muscle, back pain, joint pain or stiffness.  HEMATOLOGIC: No anemia, bleeding or bruising.  LYMPHATICS: No enlarged nodes. No history of splenectomy.  PSYCHIATRIC: No history of depression or anxiety.      Physical Examination Blood pressure (!) 164/78, pulse (!) 46, resp. rate 15, height 5\' 8"  (1.727 m), weight 178 lb (80.7 kg), SpO2 99 %. No intake or output data in the 24 hours ending 02/06/17 1326  HEENT: sclera clear, throat clear  Cardiovascular: RRR, no m/r/g, no jvd  Respiratory: CTAB  GI: abdomen soft, NT, ND  MSK: no LE edema  Neuro: no focal deficits  Psych: appropriate affect   Lab Results  Basic Metabolic Panel:  Recent Labs Lab 02/06/17 1207  NA 129*  K 4.1  CL 94*  CO2 28  GLUCOSE 113*  BUN 47*  CREATININE 1.88*  CALCIUM 9.4    Liver Function Tests: No results for input(s): AST, ALT, ALKPHOS, BILITOT, PROT, ALBUMIN in the last 168 hours.  CBC:  Recent Labs Lab 02/06/17 1207  WBC 14.9*  HGB 11.9*  HCT 34.9*  MCV 91.1  PLT 210    Cardiac Enzymes: No results for input(s): CKTOTAL, CKMB, CKMBINDEX, TROPONINI in the last 168 hours.  BNP: Invalid input(s): POCBNP  Impression/Recommendations 1. Chest pain/NSTEMI - atypical symptoms most consistent with GI etiology, however with significantly elevated istat troponin, inferior EKG ST/T changes - cycle enzymes and EKG overnight.  - start medical therapy with ASA, high dose statin(would change simva 14m to atorva 80), hep gtt. Lower bystolic to 5mg  due to sinus bradycardia. no ACE-I due to poor renal function.  - IVFs overnight, pending on renal function consider cath tomorrow ( I have written for IVFs NS at 70mL/hr x 10 hrs).  Please make NPO - please order echo with admission orders   2. Hyponatremia - per primary team  3. Leukocytosis - per primary team  4. Sinus bradycardia - stable bp's, sinus to low 50s. - would lower his home bystolic to 42m gdaily.    Carlyle Dolly, M.D.,

## 2017-02-07 ENCOUNTER — Inpatient Hospital Stay (HOSPITAL_COMMUNITY): Payer: Medicare Other

## 2017-02-07 ENCOUNTER — Encounter (HOSPITAL_COMMUNITY)
Admission: EM | Disposition: A | Discharge status: Hospice | Payer: Self-pay | Source: Home / Self Care | Attending: Family Medicine

## 2017-02-07 DIAGNOSIS — R778 Other specified abnormalities of plasma proteins: Secondary | ICD-10-CM

## 2017-02-07 DIAGNOSIS — I5030 Unspecified diastolic (congestive) heart failure: Secondary | ICD-10-CM

## 2017-02-07 DIAGNOSIS — R531 Weakness: Secondary | ICD-10-CM

## 2017-02-07 DIAGNOSIS — E118 Type 2 diabetes mellitus with unspecified complications: Secondary | ICD-10-CM

## 2017-02-07 DIAGNOSIS — R748 Abnormal levels of other serum enzymes: Secondary | ICD-10-CM

## 2017-02-07 DIAGNOSIS — I159 Secondary hypertension, unspecified: Secondary | ICD-10-CM

## 2017-02-07 DIAGNOSIS — I214 Non-ST elevation (NSTEMI) myocardial infarction: Secondary | ICD-10-CM

## 2017-02-07 DIAGNOSIS — E440 Moderate protein-calorie malnutrition: Secondary | ICD-10-CM | POA: Insufficient documentation

## 2017-02-07 DIAGNOSIS — N183 Chronic kidney disease, stage 3 (moderate): Secondary | ICD-10-CM

## 2017-02-07 DIAGNOSIS — R7989 Other specified abnormal findings of blood chemistry: Secondary | ICD-10-CM

## 2017-02-07 HISTORY — PX: LEFT HEART CATH AND CORONARY ANGIOGRAPHY: CATH118249

## 2017-02-07 LAB — GLUCOSE, CAPILLARY
GLUCOSE-CAPILLARY: 66 mg/dL (ref 65–99)
GLUCOSE-CAPILLARY: 74 mg/dL (ref 65–99)
Glucose-Capillary: 70 mg/dL (ref 65–99)
Glucose-Capillary: 65 mg/dL (ref 65–99)
Glucose-Capillary: 49 mg/dL — ABNORMAL LOW (ref 65–99)
Glucose-Capillary: 123 mg/dL — ABNORMAL HIGH (ref 65–99)

## 2017-02-07 LAB — CBC
HEMATOCRIT: 30.3 % — AB (ref 39.0–52.0)
HEMOGLOBIN: 10.3 g/dL — AB (ref 13.0–17.0)
MCH: 31 pg (ref 26.0–34.0)
MCHC: 34 g/dL (ref 30.0–36.0)
MCV: 91.3 fL (ref 78.0–100.0)
Platelets: 188 10*3/uL (ref 150–400)
RBC: 3.32 MIL/uL — ABNORMAL LOW (ref 4.22–5.81)
RDW: 12.4 % (ref 11.5–15.5)
WBC: 12.6 10*3/uL — AB (ref 4.0–10.5)

## 2017-02-07 LAB — BASIC METABOLIC PANEL
ANION GAP: 8 (ref 5–15)
BUN: 36 mg/dL — ABNORMAL HIGH (ref 6–20)
CALCIUM: 8.6 mg/dL — AB (ref 8.9–10.3)
CHLORIDE: 99 mmol/L — AB (ref 101–111)
CO2: 26 mmol/L (ref 22–32)
CREATININE: 1.72 mg/dL — AB (ref 0.61–1.24)
GFR calc non Af Amer: 37 mL/min — ABNORMAL LOW (ref >60)
GFR, EST AFRICAN AMERICAN: 42 mL/min — AB (ref >60)
Glucose, Bld: 80 mg/dL (ref 65–99)
Potassium: 4.1 mmol/L (ref 3.5–5.1)
Sodium: 133 mmol/L — ABNORMAL LOW (ref 135–145)

## 2017-02-07 LAB — HEPARIN LEVEL (UNFRACTIONATED)
HEPARIN UNFRACTIONATED: 0.28 [IU]/mL — AB (ref 0.30–0.70)
HEPARIN UNFRACTIONATED: 0.32 [IU]/mL (ref 0.30–0.70)

## 2017-02-07 LAB — PROTIME-INR
INR: 1.11
PROTHROMBIN TIME: 14.4 s (ref 11.4–15.2)

## 2017-02-07 LAB — ECHOCARDIOGRAM COMPLETE
Height: 68 in
Weight: 2667.2 oz

## 2017-02-07 LAB — TROPONIN I
TROPONIN I: 1.09 ng/mL — AB (ref <0.03)
TROPONIN I: 1.3 ng/mL — AB (ref <0.03)

## 2017-02-07 SURGERY — LEFT HEART CATH AND CORONARY ANGIOGRAPHY
Anesthesia: LOCAL

## 2017-02-07 MED ORDER — HEPARIN SODIUM (PORCINE) 1000 UNIT/ML IJ SOLN
INTRAMUSCULAR | Status: DC | PRN
  Administered 2017-02-07: 4000 [IU] via INTRAVENOUS

## 2017-02-07 MED ORDER — FENTANYL CITRATE (PF) 100 MCG/2ML IJ SOLN
INTRAMUSCULAR | Status: DC | PRN
  Administered 2017-02-07: 50 ug via INTRAVENOUS

## 2017-02-07 MED ORDER — LIDOCAINE HCL (PF) 1 % IJ SOLN
INTRAMUSCULAR | Status: AC
  Filled 2017-02-07: qty 30

## 2017-02-07 MED ORDER — ADENOSINE 12 MG/4ML IV SOLN
INTRAVENOUS | Status: AC
  Filled 2017-02-07: qty 16

## 2017-02-07 MED ORDER — MIDAZOLAM HCL 2 MG/2ML IJ SOLN
INTRAMUSCULAR | Status: DC | PRN
  Administered 2017-02-07: 1 mg via INTRAVENOUS

## 2017-02-07 MED ORDER — SODIUM CHLORIDE 0.9 % IV SOLN: INTRAVENOUS | Status: DC

## 2017-02-07 MED ORDER — IOPAMIDOL (ISOVUE-370) INJECTION 76%
INTRAVENOUS | Status: DC | PRN
  Administered 2017-02-07: 75 mL via INTRA_ARTERIAL

## 2017-02-07 MED ORDER — SODIUM CHLORIDE 0.9% FLUSH: 3.0000 mL | INTRAVENOUS | Status: DC | PRN

## 2017-02-07 MED ORDER — VERAPAMIL HCL 2.5 MG/ML IV SOLN
INTRAVENOUS | Status: DC | PRN
  Administered 2017-02-07: 10 mL via INTRA_ARTERIAL

## 2017-02-07 MED ORDER — SODIUM CHLORIDE 0.9% FLUSH
3.0000 mL | INTRAVENOUS | Status: DC | PRN
  Administered 2017-02-12: 3 mL via INTRAVENOUS
  Filled 2017-02-07: qty 3

## 2017-02-07 MED ORDER — GLUCERNA SHAKE PO LIQD
237.0000 mL | Freq: Two times a day (BID) | ORAL | Status: DC
  Administered 2017-02-07: 237 mL via ORAL

## 2017-02-07 MED ORDER — VERAPAMIL HCL 2.5 MG/ML IV SOLN
INTRAVENOUS | Status: AC
  Filled 2017-02-07: qty 2

## 2017-02-07 MED ORDER — NITROGLYCERIN 1 MG/10 ML FOR IR/CATH LAB
INTRA_ARTERIAL | Status: DC | PRN
  Administered 2017-02-07: 200 ug via INTRACORONARY

## 2017-02-07 MED ORDER — BETAMETHASONE SOD PHOS & ACET 6 (3-3) MG/ML IJ SUSP: 3.0000 mg | Freq: Once | INTRAMUSCULAR | Status: DC

## 2017-02-07 MED ORDER — HEPARIN (PORCINE) IN NACL 2-0.9 UNIT/ML-% IJ SOLN
INTRAMUSCULAR | Status: DC | PRN
  Administered 2017-02-07: 1000 mL

## 2017-02-07 MED ORDER — LIDOCAINE HCL (PF) 1 % IJ SOLN
INTRAMUSCULAR | Status: DC | PRN
  Administered 2017-02-07: 2 mL

## 2017-02-07 MED ORDER — SODIUM CHLORIDE 0.9% FLUSH
3.0000 mL | Freq: Two times a day (BID) | INTRAVENOUS | Status: DC
  Administered 2017-02-08 – 2017-02-12 (×5): 3 mL via INTRAVENOUS

## 2017-02-07 MED ORDER — SODIUM CHLORIDE 0.9 % IV SOLN: 250.0000 mL | INTRAVENOUS | Status: DC | PRN

## 2017-02-07 MED ORDER — DEXTROSE 50 % IV SOLN
INTRAVENOUS | Status: AC
  Administered 2017-02-07: 50 mL
  Filled 2017-02-07: qty 50

## 2017-02-07 MED ORDER — MIDAZOLAM HCL 2 MG/2ML IJ SOLN
INTRAMUSCULAR | Status: AC
  Filled 2017-02-07: qty 2

## 2017-02-07 MED ORDER — ISOSORBIDE MONONITRATE ER 30 MG PO TB24
30.0000 mg | ORAL_TABLET | Freq: Every day | ORAL | Status: DC
  Administered 2017-02-07 – 2017-02-12 (×4): 30 mg via ORAL
  Filled 2017-02-07 (×4): qty 1

## 2017-02-07 MED ORDER — HEPARIN SODIUM (PORCINE) 1000 UNIT/ML IJ SOLN
INTRAMUSCULAR | Status: AC
  Filled 2017-02-07: qty 1

## 2017-02-07 MED ORDER — SODIUM CHLORIDE 0.9% FLUSH
3.0000 mL | Freq: Two times a day (BID) | INTRAVENOUS | Status: DC
  Administered 2017-02-07: 3 mL via INTRAVENOUS

## 2017-02-07 MED ORDER — NITROGLYCERIN 1 MG/10 ML FOR IR/CATH LAB
INTRA_ARTERIAL | Status: AC
  Filled 2017-02-07: qty 10

## 2017-02-07 MED ORDER — FENTANYL CITRATE (PF) 100 MCG/2ML IJ SOLN
INTRAMUSCULAR | Status: AC
  Filled 2017-02-07: qty 2

## 2017-02-07 MED ORDER — AMLODIPINE BESYLATE 5 MG PO TABS
5.0000 mg | ORAL_TABLET | Freq: Every day | ORAL | Status: DC
  Administered 2017-02-07: 5 mg via ORAL
  Filled 2017-02-07: qty 1

## 2017-02-07 SURGICAL SUPPLY — 10 items
CATH OPTITORQUE TIG 4.0 5F (CATHETERS) ×2 IMPLANT
DEVICE RAD COMP TR BAND LRG (VASCULAR PRODUCTS) ×2 IMPLANT
GLIDESHEATH SLEND A-KIT 6F 22G (SHEATH) ×2 IMPLANT
GUIDEWIRE INQWIRE 1.5J.035X260 (WIRE) ×1 IMPLANT
INQWIRE 1.5J .035X260CM (WIRE) ×2
KIT ESSENTIALS PG (KITS) IMPLANT
KIT HEART LEFT (KITS) ×2 IMPLANT
PACK CARDIAC CATHETERIZATION (CUSTOM PROCEDURE TRAY) ×2 IMPLANT
TRANSDUCER W/STOPCOCK (MISCELLANEOUS) ×2 IMPLANT
TUBING CIL FLEX 10 FLL-RA (TUBING) ×2 IMPLANT

## 2017-02-07 NOTE — Progress Notes (Signed)
ANTICOAGULATION CONSULT NOTE - Follow Up Consult  Pharmacy Consult for Heparin Indication: chest pain/ACS  No Known Allergies  Patient Measurements: Height: 5\' 8"  (172.7 cm) Weight: 166 lb 11.2 oz (75.6 kg) IBW/kg (Calculated) : 68.4 Heparin Dosing Weight:    Vital Signs: Temp: 98.8 F (37.1 C) (04/16 1307) Temp Source: Oral (04/16 1307) BP: 150/65 (04/16 1307) Pulse Rate: 58 (04/16 1307)  Labs:  Recent Labs  02/06/17 1207 02/06/17 2048 02/07/17 0038 02/07/17 0618 02/07/17 1435  HGB 11.9*  --   --  10.3*  --   HCT 34.9*  --   --  30.3*  --   PLT 210  --   --  188  --   LABPROT  --   --   --  14.4  --   INR  --   --   --  1.11  --   HEPARINUNFRC  --  0.33  --  0.28* 0.32  CREATININE 1.88*  --   --  1.72*  --   TROPONINI  --   --  1.30* 1.09*  --     Estimated Creatinine Clearance: 34.8 mL/min (A) (by C-G formula based on SCr of 1.72 mg/dL (H)).   Assessment: 76 y/o male on heparin for atypical chest pain and positive troponin. Heparin level is therapeutic at 0.32 on 1150 units/hr but was drawn early. He is also awaiting cath today. No bleeding noted.    Goal of Therapy:  Heparin level 0.3-0.7 units/ml Monitor platelets by anticoagulation protocol: Yes    Plan:  - Heparin drip is off as of 15:45 - F/U after cath  Renold Genta, PharmD, BCPS Clinical Pharmacist Phone for tonight - [place] - [phone] 02/07/2017 3:39 PM

## 2017-02-07 NOTE — Progress Notes (Signed)
Initial Nutrition Assessment  DOCUMENTATION CODES:   Non-severe (moderate) malnutrition in context of chronic illness  INTERVENTION:    Glucerna Shake po BID, each supplement provides 220 kcal and 10 grams of protein  NUTRITION DIAGNOSIS:   Malnutrition related to chronic illness (DM, CKD, HTN) as evidenced by mild depletion of muscle mass, mild depletion of body fat, percent weight loss (12% Weight loss within 6 months).  GOAL:   Patient will meet greater than or equal to 90% of their needs  MONITOR:   PO intake, Supplement acceptance, Labs, I & O's  REASON FOR ASSESSMENT:   Malnutrition Screening Tool   ASSESSMENT:   76 y.o. male who has a 54 yr h/o DM, CKD stage 3, recent difficulty with HTN control who was admitted with somewhat atypical chest pain and also has had difficulty with swallowing food.   Patient reports that he usually eats okay, but has had trouble "keeping food down" recently. He attributes recent weight loss to this problem and fluid loss with diuretic therapy. He has started eating soft foods that he can tolerated better. He agreed to try Glucerna Shake supplements to maximize oral intake. 12% weight loss within the past 6 months is significant for the time frame.  Nutrition-Focused physical exam completed. Findings are mild-moderate fat depletion, mild-moderate muscle depletion, and no edema.  Labs and medications reviewed. Currently NPO for procedure; will order Glucerna Shake supplement to begin after the procedure when diet advanced.  Diet Order:  Diet NPO time specified Except for: Sips with Meds  Skin:  Reviewed, no issues  Last BM:  4/15  Height:   Ht Readings from Last 1 Encounters:  02/06/17 5\' 8"  (1.727 m)    Weight:   Wt Readings from Last 1 Encounters:  02/07/17 166 lb 11.2 oz (75.6 kg)    Ideal Body Weight:  70 kg  BMI:  Body mass index is 25.35 kg/m.  Estimated Nutritional Needs:   Kcal:  1700-1749  Protein:  85-95  gm  Fluid:  1.8 L  EDUCATION NEEDS:   No education needs identified at this time  Molli Barrows, [place], [place], [place] Pager [phone] After Hours Pager [phone]

## 2017-02-07 NOTE — Consult Note (Signed)
[facility] CM Primary Care Navigator  02/07/2017  Charles Hanson. [birth year] [id number]   Met with patientand daughter Charles Hanson) at the bedside to identify possible discharge needs. Patient reports feeling "tiredness and weakness" that led to this admission. Patient endorses Charles Holler, NP (or the other NP since PCP is no longer in the practice) with [name] as the primary care provider.   Patient shared using [facility] in [place] to obtain medications without any problem.   Patient states he manages his own medications at home straight out of the containers.   He is able to drive prior to admission, however, his children or wife's caregiver will provide transportation to his doctors' appointments as needed after discharge.  Patient's children or sister Charles Hanson) will provide assistance with his care at [name] needed.  Discharge plan is pending cath evaluation today perdaughter.  Patient and daughter voiced understanding to call primary care provider's office when he returns home, for a post discharge follow-up appointment within a week or sooner if needed.Patient letter (with PCP's contact number) was provided as areminder.  Explained to patient and daughter about [facility] CM services available for health management but patient denies any needs at this time. He reports that he had been "managing DM for the longest time". He reports that DM is under control using Glimepiride and with primary care provider's help managing it. Recent A1c is currently pending (in process). MD note states CBGs remain controlled at 70s and glimepiride was held during this admission.   Encouraged patient and daughter to discuss with primary care provider on follow-up visit to see if further management is needed at home and ask for referral to [name] care management if deemed appropriate/ necessary for the services. [facility] contact information provided for future needs  that may arise.    For questions, please contact:  [name], BSN, RN- [name]  Telephone: [phone] [place]

## 2017-02-07 NOTE — Progress Notes (Signed)
ANTICOAGULATION CONSULT NOTE - Follow Up Consult  Pharmacy Consult for Heparin Indication: chest pain/ACS  No Known Allergies  Patient Measurements: Height: 5\' 8"  (172.7 cm) Weight: 166 lb 11.2 oz (75.6 kg) IBW/kg (Calculated) : 68.4 Heparin Dosing Weight:    Vital Signs: Temp: 98.8 F (37.1 C) (04/16 0444) Temp Source: Oral (04/16 0444) BP: 163/68 (04/16 0444) Pulse Rate: 51 (04/16 0444)  Labs:  Recent Labs  02/06/17 1207 02/06/17 2048 02/07/17 0038 02/07/17 0618  HGB 11.9*  --   --  10.3*  HCT 34.9*  --   --  30.3*  PLT 210  --   --  188  HEPARINUNFRC  --  0.33  --  0.28*  CREATININE 1.88*  --   --  1.72*  TROPONINI  --   --  1.30* 1.09*    Estimated Creatinine Clearance: 34.8 mL/min (A) (by C-G formula based on SCr of 1.72 mg/dL (H)).   Assessment:  Anticoag: ACS -Heparin gtt. No anticoagulants pta. HL 0.28 down slightly. Hgb 11.9>10.3. Plts 188  Goal of Therapy:  Heparin level 0.3-0.7 units/ml Monitor platelets by anticoagulation protocol: Yes   Plan:  Heparin increase to 1150 units/hr HL and CBC with AM labs Possible cath today.   Alyza Artiaga S. Alford Highland, PharmD, BCPS Clinical Staff Pharmacist Pager [phone]  Eilene Ghazi Stillinger 02/07/2017,8:07 AM

## 2017-02-07 NOTE — Progress Notes (Addendum)
Paged cardiology, [place] to come look at radial cath site with 7cc of air left in band and level 1. Area marked and report given to next shift RN with assessment at bedside.   Saddie Benders RN

## 2017-02-07 NOTE — Plan of Care (Signed)
Problem: Pain Managment: Goal: General experience of comfort will improve Outcome: Completed/Met Date Met: 02/06/17 Pt currently pain free/verbalizes understanding to call RN for any CP or other discomfort.

## 2017-02-07 NOTE — Progress Notes (Signed)
Family Medicine Teaching Service Daily Progress Note Intern Pager: [phone]  Patient name: Charles Hanson. Medical record number: [id number] Date of birth: 1941-05-08 Age: 76 y.o. Gender: male  Primary Care Provider: [facility] Consultants: Cardiology  Code Status: Full  Pt Overview and Major Events to Date:  04/15: Admit for NSTEMI, heparin gtt initiated, minor AKI 04/16: Cr improved, cath performed  Assessment and Plan: Charles Hanson. is a 76 y.o. male presenting with generalized weakness and chest pain. PMH is significant for HTN, HLD, CKDIII, NIDDM.  #Atypical chest pain  Inferior-NSTEMI  Bradycardia: Acute. HEART score 8. EKG concerning for inferior lead changes. Sinus bradycardia persists. CXR unremarkable. Troponin acme 1.30, now resolving on heparin gtt for NSTEMI. Patient has 20-pack-year history. No history of catheterizations.  --Cardiology consulted, appreciate recommendations: Trending troponin and EKG overnight, continue ASA and statin with heparin gtt, avoid ACE-I and lower home by bystolic to 5 mg, plan for cath tomorrow, NPO at MN --Heparin gtt --ASA 81 mg QD, nitro PRN, Zofran PRN, O2 therapy to keep sat equal >92% --Lipitor 80 mg QD --Echocardiogram pending --Monitor BP and HR --Cardiac monitoring, continuous pulse ox  #Hypertension  Hyperlipidemia: Chronic. Stable. BP at 160s/70s. On Lasix, Bystolic, and losartan-hydrochlorothiazide at home. On statin therapy. --Bradycardia per above, holding antihypertensives given stability --Initiating Lipitor 80 mg QD, will likely need high-intensity statin on discharge --ASA 81 mg QD  #Acute kidney injury  Chronic kidney disease stage III: Chronic. Resolved. Baseline 1.73 per chart review. Creatinine clearance 32 mL/min. --Holding antihypertensives above given AKI --IVF NS@75mL /hr for x10 hrs --Avoid nephrotoxic agents --Daily BMET  #Non-insulin-dependent diabetes mellitus: Chronic. Stable. On  glimepiride at home. She only completed dose of prednisone for plantar fasciitis. CBGs remain controlled at 53s. --Sensitive sliding scale while on diet until MN, holding basal/bolus insulin --Holding glimepiride 2 mg QD --A1c pending  FEN/GI: HH/carb modified diet, NPO at MN except sips meds Prophylaxis: Heparin gtt  Disposition: Pending cath evaluation 4/16 for NSTEMI.  Subjective:  Patient feels great this morning. Denies CP or SOB. Says he will be getting a cath later today. No other questions or concerns.   Objective: Temp:  [98.7 F (37.1 C)-99.1 F (37.3 C)] 98.8 F (37.1 C) (04/16 0444) Pulse Rate:  [42-72] 51 (04/16 0444) Resp:  [12-28] 16 (04/15 1619) BP: (160-197)/(68-89) 163/68 (04/16 0444) SpO2:  [94 %-100 %] 97 % (04/16 0444) Weight:  [165 lb 8 oz (75.1 kg)-178 lb (80.7 kg)] 166 lb 11.2 oz (75.6 kg) (04/16 0444) Physical Exam: General: elderly male, well nourished, well developed, in no acute distress with non-toxic appearance HEENT: normocephalic, atraumatic, moist mucous membranes Neck: supple, non-tender without lymphadenopathy, no JVD CV: bradycardic with regular rhythm without murmurs, rubs, or gallops Lungs: clear to auscultation bilaterally with normal work of breathing on room air Abdomen: soft, non-tender, no masses or organomegaly palpable, normoactive bowel sounds Skin: warm, dry, no rashes or lesions, cap refill < 2 seconds Extremities: warm and well perfused, normal tone  Laboratory:  Recent Labs Lab 02/06/17 1207 02/07/17 0618  WBC 14.9* 12.6*  HGB 11.9* 10.3*  HCT 34.9* 30.3*  PLT 210 188    Recent Labs Lab 02/06/17 1207 02/06/17 1330 02/07/17 0618  NA 129*  --  133*  K 4.1  --  4.1  CL 94*  --  99*  CO2 28  --  26  BUN 47*  --  36*  CREATININE 1.88*  --  1.72*  CALCIUM 9.4  --  8.6*  PROT  --  5.9*  --   BILITOT  --  1.0  --   ALKPHOS  --  128*  --   ALT  --  29  --   AST  --  35  --   GLUCOSE 113*  --  80   Troponin: 0.89  > 1.30 > 1.09 UA: Protein 300 TSH: 1.798 (WNL) BNP: 387.7 (H) Hepatic panel: Alkaline phosphatase 128 (H), albumin 2.7 (L), total protein 5.9 (L) Lipase: 41 (WNL) Lipid panel: cholesterol 190 (WNL), TGY 88 (WNL), HDL 60 (WNL), LDL 112 (H)  Imaging/Diagnostic Tests: DG Chest 2 View (02/06/2017) FINDINGS: Normal mediastinum and cardiac silhouette. Normal pulmonary vasculature. No evidence of effusion, infiltrate, or pneumothorax. No acute bony abnormality. Degenerative osteophytosis of the spine.  IMPRESSION: No acute cardiopulmonary process.  Moffett Bing, DO 02/07/2017, 8:43 AM PGY-1, [place] Intern pager: [phone], text pages welcome

## 2017-02-07 NOTE — Progress Notes (Signed)
Progress Note  Patient Name: Charles Hanson. Date of Encounter: 02/07/2017  Primary Cardiologist: Branch (new)  Subjective   No chest pain; c/o weakness  Inpatient Medications    Scheduled Meds: . aspirin EC  81 mg Oral Daily  . atorvastatin  80 mg Oral q1800  . dorzolamide  1 drop Both Eyes BID  . insulin aspart  0-9 Units Subcutaneous Q4H  . latanoprost  1 drop Both Eyes QHS  . traZODone  50 mg Oral QHS   Continuous Infusions: . sodium chloride 75 mL/hr at 02/07/17 0337  . heparin 1,050 Units/hr (02/06/17 2220)   PRN Meds: acetaminophen, nitroGLYCERIN, ondansetron (ZOFRAN) IV   Vital Signs    Vitals:   02/06/17 1619 02/06/17 2030 02/07/17 0030 02/07/17 0444  BP: (!) 160/89 (!) 183/72 (!) 168/70 (!) 163/68  Pulse: (!) 59 (!) 54 (!) 52 (!) 51  Resp: 16     Temp: 98.7 F (37.1 C) 99.1 F (37.3 C)  98.8 F (37.1 C)  TempSrc: Oral Oral  Oral  SpO2: 99% 100%  97%  Weight: 165 lb 8 oz (75.1 kg)   166 lb 11.2 oz (75.6 kg)  Height: 5\' 8"  (1.727 m)       Intake/Output Summary (Last 24 hours) at 02/07/17 0829 Last data filed at 02/07/17 0326  Gross per 24 hour  Intake           1145.8 ml  Output                0 ml  Net           1145.8 ml    I/O since admission:  +1145  Filed Weights   02/06/17 1155 02/06/17 1619 02/07/17 0444  Weight: 178 lb (80.7 kg) 165 lb 8 oz (75.1 kg) 166 lb 11.2 oz (75.6 kg)    Telemetry    Sinus rhythm 58 - 62   Personally Reviewed  ECG    ECG (independently read by me): SB at 52; T changes in III is less c/w yesterday; early transition -  Physical Exam   BP (!) 163/68   Pulse (!) 51   Temp 98.8 F (37.1 C) (Oral)   Resp 16   Ht 5\' 8"  (1.727 m)   Wt 166 lb 11.2 oz (75.6 kg)   SpO2 97%   BMI 25.35 kg/m  General: Alert, oriented, no distress.  Skin: normal turgor, no rashes, warm and dry HEENT: Normocephalic, atraumatic. Pupils equal round and reactive to light; sclera anicteric; extraocular muscles intact;  Nose  without nasal septal hypertrophy Mouth/Parynx benign; Mallinpatti scale 3 Neck: No JVD, no carotid bruits; normal carotid upstroke Lungs: clear to ausculatation and percussion; no wheezing or rales Chest wall: without tenderness to palpitation Heart: PMI not displaced, RRR, s1 s2 normal, 1/6 systolic murmur, no diastolic murmur, no rubs, gallops, thrills, or heaves Abdomen: soft, nontender; no hepatosplenomehaly, BS+; abdominal aorta nontender and not dilated by palpation. Back: no CVA tenderness Pulses 2+ Musculoskeletal: full range of motion, normal strength, no joint deformities Extremities: no clubbing cyanosis or edema, Homan's sign negative  Neurologic: grossly nonfocal; Cranial nerves grossly wnl Psychologic: Normal mood and affect   Labs    Chemistry Recent Labs Lab 02/06/17 1207 02/06/17 1330 02/07/17 0618  NA 129*  --  133*  K 4.1  --  4.1  CL 94*  --  99*  CO2 28  --  26  GLUCOSE 113*  --  80  BUN 47*  --  36*  CREATININE 1.88*  --  1.72*  CALCIUM 9.4  --  8.6*  PROT  --  5.9*  --   ALBUMIN  --  2.7*  --   AST  --  35  --   ALT  --  29  --   ALKPHOS  --  128*  --   BILITOT  --  1.0  --   GFRNONAA 33*  --  37*  GFRAA 38*  --  42*  ANIONGAP 7  --  8     Hematology Recent Labs Lab 02/06/17 1207 02/07/17 0618  WBC 14.9* 12.6*  RBC 3.83* 3.32*  HGB 11.9* 10.3*  HCT 34.9* 30.3*  MCV 91.1 91.3  MCH 31.1 31.0  MCHC 34.1 34.0  RDW 12.6 12.4  PLT 210 188    Cardiac Enzymes Recent Labs Lab 02/07/17 0038 02/07/17 0618  TROPONINI 1.30* 1.09*    Recent Labs Lab 02/06/17 1226  TROPIPOC 0.89*     BNP Recent Labs Lab 02/06/17 1311  BNP 387.7*     DDimer No results for input(s): DDIMER in the last 168 hours.   Lipid Panel     Component Value Date/Time   CHOL 190 02/06/2017 2048   TRIG 88 02/06/2017 2048   HDL 60 02/06/2017 2048   CHOLHDL 3.2 02/06/2017 2048   VLDL 18 02/06/2017 2048   LDLCALC 112 (H) 02/06/2017 2048    Radiology      Dg Chest 2 View  Result Date: 02/06/2017 CLINICAL DATA:  Pt c/o feeling fatigue and having generalized weakness ongoing for "a while." Pt reports seen by PMD for same but not getting any answers. Pt also c/o burning sensation in the middle of his chest when he eats. EXAM: CHEST  2 VIEW COMPARISON:  None. FINDINGS: Normal mediastinum and cardiac silhouette. Normal pulmonary vasculature. No evidence of effusion, infiltrate, or pneumothorax. No acute bony abnormality. Degenerative osteophytosis of the spine. IMPRESSION: No acute cardiopulmonary process. Electronically Signed   By: Suzy Bouchard M.D.   On: 02/06/2017 13:25    Cardiac Studies   Echo to be done  Patient Profile     76 y.o. male who has a 2 yr h/o DM, CKD stage 3, recent difficulty with HTN control who was admitted with somewhat atypical chest pain and also has had difficulty with swallowing food.   Assessment & Plan    1. Positive troponin: ? NSTEMI with Trop 1.3 now 1.09. ECG shows inferior T wave changes. With cardiac risk factors including DM, HTN, remote tobacco, family history and troponin elevation with ECG findings, recommend definitive cath.  Will add on for later today, but will increase IV fluids to 100 cc NS, he has been on 75 cc/hr since last evening. Will check echo this am to assess LV fxn    2. CKD 3: Followed by Dr. Posey Pronto. Reported baseline Cr 1.73.  Cr today is 1.72, improved from 1.88 yesterday.  Will hydrate today; cath with limited contrast; no LV gram.  3. HTN: not well controlled. Was on lasix, bystolic, losartan HCT,. Currently on hold since admission with Cr and bradycardia.  Consider amlodipine 5 mg.   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  5. Hyperlipidemia:  With DM will need high potency statin with target LDL < 70.  6. Dysphagia: may ultimately need endoscopy ? Stricture.  Will schedule for echo this am; hydrate and plan for add-on cath later today. The risks and benefits of a cardiac  catheterization including,  but not limited to, death, stroke, MI, kidney damage and bleeding were discussed with the patient who indicates understanding and agrees to proceed.    Signed, Troy Sine, MD, [facility] 02/07/2017, 8:29 AM

## 2017-02-07 NOTE — Progress Notes (Signed)
  Echocardiogram 2D Echocardiogram has been performed.  Charles Hanson T Charles Hanson 02/07/2017, 3:25 PM

## 2017-02-07 NOTE — Interval H&P Note (Signed)
History and Physical Interval Note:  02/07/2017 4:22 PM  Charles Hanson.  has presented today for surgery, with the diagnosis of n stemi - presented with weakness & ruled in with + Troponins.  The various methods of treatment have been discussed with the patient and family. After consideration of risks, benefits and other options for treatment, the patient has consented to  Procedure(s): Left Heart Cath and Coronary Angiography (N/A) with possible Percutaneous Coronary Intervention as a surgical intervention .    The patient's history has been reviewed, patient examined, no change in status, stable for surgery.  I have reviewed the patient's chart and labs.  Questions were answered to the patient's satisfaction.    Cath Lab Visit (complete for each Cath Lab visit)  Clinical Evaluation Leading to the Procedure:   ACS: Yes.    Non-ACS:    Anginal Classification: No Symptoms  Anti-ischemic medical therapy: Minimal Therapy (1 class of medications)  Non-Invasive Test Results: No non-invasive testing performed  Prior CABG: No previous CABG   Glenetta Hew

## 2017-02-07 NOTE — Discharge Summary (Signed)
[facility] Discharge Summary  Patient name: Charles Hanson. Medical record number: [id number] Date of birth: Mar 27, 1941 Age: 76 y.o. Gender: male Date of Admission: 02/06/2017  Date of Discharge: 02/18/2017 Admitting Physician: Zenia Resides, MD  Primary Care Provider: [facility] Consultants: Cardiology  Indication for Hospitalization: Chest pain  Discharge Diagnoses/Problem List:  Atypical chest pain Inferior-NSTEMI Bradycardia Hypertension Hyperlipidemia Acute on chronic kidney disease stage III Non-insulin-dependent diabetes mellitus Moderate protein calorie malnutrition Watershed embolic stroke  Disposition: Hospice  Discharge Condition: Stable  Discharge Exam:  See previous progress note  Brief Hospital Course:  Charles Hansonis a 76 y.o.malepresenting with generalized weakness and chest pain found to have NSTEMI and developed embolic strokes after cardiac cath. PMH is significant for HTN, HLD, CKDIII, NIDDM, moderate protein calorie malnutrition.  Patient presented with a 10 day history of progressive generalized weakness, fatigue, and atypical chest pain.  Chest pain occurred with swallowing food, described as a burning sensation, and that food would get stuck resulting in him vomiting the food back up.  This has been progressive over the last 3 months with family estimating a 20 lb weight loss  His fatigue was accompanied with a decrease in activity due to the pain in his feet in which he received a steroid injection by a podiatrist 3 days prior to admit. Additionally, he had just started taking bystolic for HTN although his family admits he only takes the medicine his doctor gives him for free.    On admit, he was found to be bradycardic and hypertensive with positive troponin's and EKG changes c/w NSTEMI with inferior lead involvement.  He was placed on heparin gtt.  Cardiology consulted and he was taken for a LHC on  4/16.  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 GI also consulted for evaluation of his dysphagia.    On 4/17, the patient was found to have new onset left hand weakness.  A code stroke was called and MRI obtained. NIHSS was 0.  It was some debate over if this left hand weakness was new or ongoing since 4/15  according to the family. TPA was deferred given the uncertainty of LWN time.  MRI revealed innumerable multifocal areas of ischemic infarction consistent with watershed embolic stroke.    Overnight on 4/17, his neurological status continued to deteriorate which was somewhat obscured by the ativan he was receiving per the CIWA protocol for alcohol abuse.  He was lethargic but following commands but found to have a left gaze, right neglect.  He additionally developed intermittent right facial twitching.  EEG did not indicate seizure activity but exam limited due to excessive motion artifacts; Depacon was started. 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    Critical care was called for concern for airway protection and monitoring. While in the ICU, patient continued to neurologically deteriorate reaching a NIHS score of 30. He was made DNR/DNI. He developed acute respiratory failure, was given supplemental O2, Lasix, and Unasyn to cover for aspiration pneumonia. He was started on tube feeds.   Palliative care was consulted on 4/21, the plan at the time was to medically manage but if he didn't improve by 4/22 then to switch to comfort care. Patient continued to decline and on 4/22 he was made comfort care and placed on Morphine  drip. Tube feeds were discontinued and he was given Ativan PRN.    Issues for Follow Up:  1. Comfort measures- transferring to hospice care today  Significant Procedures: Cardiac  cath  Significant Labs and Imaging:   Recent Labs Lab 02/11/17 0245 02/12/17 0540 01/29/2017 0443  WBC 19.7* 18.8* 17.2*  HGB 10.2* 11.0* 9.7*  HCT 30.4* 31.7* 29.1*  PLT 195 192 202    Recent Labs Lab 02/08/17 0350 02/09/17 0445 02/10/17 0131 02/11/17 1629 02/12/17 0540 02/12/17 1843 02/09/2017 0443  NA 133* 134* 137  --  140  --  141  K 4.5 3.8 4.3  --  3.9  --  4.3  CL 103 105 107  --  108  --  110  CO2 23 22 22   --  20*  --  18*  GLUCOSE 107* 207* 129*  --  203*  --  345*  BUN 30* 21* 21*  --  52*  --  67*  CREATININE 1.58* 1.51* 1.63*  --  2.27*  --  2.52*  CALCIUM 8.3* 8.3* 8.3*  --  8.2*  --  8.1*  MG  --  1.4* 2.3  --  2.1 2.3 2.4  PHOS  --   --   --  2.5 4.7* 3.4 4.2     Results/Tests Pending at Time of Discharge: None  Discharge Medications:  Allergies as of 01/26/2017   No Known Allergies     Medication List    STOP taking these medications   dorzolamide 2 % ophthalmic solution Commonly known as:  TRUSOPT   furosemide 20 MG tablet Commonly known as:  LASIX   glimepiride 2 MG tablet Commonly known as:  AMARYL   latanoprost 0.005 % ophthalmic solution Commonly known as:  XALATAN   losartan-hydrochlorothiazide 100-25 MG tablet Commonly known as:  HYZAAR   meloxicam 15 MG tablet Commonly known as:  MOBIC   nebivolol 10 MG tablet Commonly known as:  BYSTOLIC   OMEGA-3 FATTY ACIDS PO   predniSONE 20 MG tablet Commonly known as:  DELTASONE   simvastatin 20 MG tablet Commonly known as:  ZOCOR   Vitamin D3 2000 units Tabs     TAKE these medications   LORazepam 2 MG/ML injection Commonly known as:  ATIVAN Inject 0.5 mLs (1 mg total) into the vein every 2 (two) hours as needed for anxiety.   morphine 250 mg in sodium chloride 0.9 % 240 mL Inject 1 mg/hr into the vein continuous.       Discharge Instructions: Please refer to Patient Instructions section of EMR for full details.  Patient was counseled important signs and symptoms that  should prompt return to medical care, changes in medications, dietary instructions, activity restrictions, and follow up appointments.   Follow-Up Appointments: Will be at [facility], MD 02/04/2017, 3:36 PM PGY-2, [place]

## 2017-02-07 NOTE — Progress Notes (Signed)
Notified by trop 1.30, previously 0.89. Pt has remained CP free/no C/O voiced, Heparin drip infusing, and pt NPO for possible cath in am if Cr has improved. Will continue to monitor. Jessie Foot, RN

## 2017-02-07 NOTE — H&P (View-Only) (Signed)
Progress Note  Patient Name: Charles Hanson. Date of Encounter: 02/07/2017  Primary Cardiologist: Branch (new)  Subjective   No chest pain; c/o weakness  Inpatient Medications    Scheduled Meds: . aspirin EC  81 mg Oral Daily  . atorvastatin  80 mg Oral q1800  . dorzolamide  1 drop Both Eyes BID  . insulin aspart  0-9 Units Subcutaneous Q4H  . latanoprost  1 drop Both Eyes QHS  . traZODone  50 mg Oral QHS   Continuous Infusions: . sodium chloride 75 mL/hr at 02/07/17 0337  . heparin 1,050 Units/hr (02/06/17 2220)   PRN Meds: acetaminophen, nitroGLYCERIN, ondansetron (ZOFRAN) IV   Vital Signs    Vitals:   02/06/17 1619 02/06/17 2030 02/07/17 0030 02/07/17 0444  BP: (!) 160/89 (!) 183/72 (!) 168/70 (!) 163/68  Pulse: (!) 59 (!) 54 (!) 52 (!) 51  Resp: 16     Temp: 98.7 F (37.1 C) 99.1 F (37.3 C)  98.8 F (37.1 C)  TempSrc: Oral Oral  Oral  SpO2: 99% 100%  97%  Weight: 165 lb 8 oz (75.1 kg)   166 lb 11.2 oz (75.6 kg)  Height: 5\' 8"  (1.727 m)       Intake/Output Summary (Last 24 hours) at 02/07/17 0829 Last data filed at 02/07/17 0326  Gross per 24 hour  Intake           1145.8 ml  Output                0 ml  Net           1145.8 ml    I/O since admission:  +1145  Filed Weights   02/06/17 1155 02/06/17 1619 02/07/17 0444  Weight: 178 lb (80.7 kg) 165 lb 8 oz (75.1 kg) 166 lb 11.2 oz (75.6 kg)    Telemetry    Sinus rhythm 58 - 62   Personally Reviewed  ECG    ECG (independently read by me): SB at 52; T changes in III is less c/w yesterday; early transition -  Physical Exam   BP (!) 163/68   Pulse (!) 51   Temp 98.8 F (37.1 C) (Oral)   Resp 16   Ht 5\' 8"  (1.727 m)   Wt 166 lb 11.2 oz (75.6 kg)   SpO2 97%   BMI 25.35 kg/m  General: Alert, oriented, no distress.  Skin: normal turgor, no rashes, warm and dry HEENT: Normocephalic, atraumatic. Pupils equal round and reactive to light; sclera anicteric; extraocular muscles intact;  Nose  without nasal septal hypertrophy Mouth/Parynx benign; Mallinpatti scale 3 Neck: No JVD, no carotid bruits; normal carotid upstroke Lungs: clear to ausculatation and percussion; no wheezing or rales Chest wall: without tenderness to palpitation Heart: PMI not displaced, RRR, s1 s2 normal, 1/6 systolic murmur, no diastolic murmur, no rubs, gallops, thrills, or heaves Abdomen: soft, nontender; no hepatosplenomehaly, BS+; abdominal aorta nontender and not dilated by palpation. Back: no CVA tenderness Pulses 2+ Musculoskeletal: full range of motion, normal strength, no joint deformities Extremities: no clubbing cyanosis or edema, Homan's sign negative  Neurologic: grossly nonfocal; Cranial nerves grossly wnl Psychologic: Normal mood and affect   Labs    Chemistry Recent Labs Lab 02/06/17 1207 02/06/17 1330 02/07/17 0618  NA 129*  --  133*  K 4.1  --  4.1  CL 94*  --  99*  CO2 28  --  26  GLUCOSE 113*  --  80  BUN 47*  --  36*  CREATININE 1.88*  --  1.72*  CALCIUM 9.4  --  8.6*  PROT  --  5.9*  --   ALBUMIN  --  2.7*  --   AST  --  35  --   ALT  --  29  --   ALKPHOS  --  128*  --   BILITOT  --  1.0  --   GFRNONAA 33*  --  37*  GFRAA 38*  --  42*  ANIONGAP 7  --  8     Hematology Recent Labs Lab 02/06/17 1207 02/07/17 0618  WBC 14.9* 12.6*  RBC 3.83* 3.32*  HGB 11.9* 10.3*  HCT 34.9* 30.3*  MCV 91.1 91.3  MCH 31.1 31.0  MCHC 34.1 34.0  RDW 12.6 12.4  PLT 210 188    Cardiac Enzymes Recent Labs Lab 02/07/17 0038 02/07/17 0618  TROPONINI 1.30* 1.09*    Recent Labs Lab 02/06/17 1226  TROPIPOC 0.89*     BNP Recent Labs Lab 02/06/17 1311  BNP 387.7*     DDimer No results for input(s): DDIMER in the last 168 hours.   Lipid Panel     Component Value Date/Time   CHOL 190 02/06/2017 2048   TRIG 88 02/06/2017 2048   HDL 60 02/06/2017 2048   CHOLHDL 3.2 02/06/2017 2048   VLDL 18 02/06/2017 2048   LDLCALC 112 (H) 02/06/2017 2048    Radiology      Dg Chest 2 View  Result Date: 02/06/2017 CLINICAL DATA:  Pt c/o feeling fatigue and having generalized weakness ongoing for "a while." Pt reports seen by PMD for same but not getting any answers. Pt also c/o burning sensation in the middle of his chest when he eats. EXAM: CHEST  2 VIEW COMPARISON:  None. FINDINGS: Normal mediastinum and cardiac silhouette. Normal pulmonary vasculature. No evidence of effusion, infiltrate, or pneumothorax. No acute bony abnormality. Degenerative osteophytosis of the spine. IMPRESSION: No acute cardiopulmonary process. Electronically Signed   By: Suzy Bouchard M.D.   On: 02/06/2017 13:25    Cardiac Studies   Echo to be done  Patient Profile     76 y.o. male who has a 31 yr h/o DM, CKD stage 3, recent difficulty with HTN control who was admitted with somewhat atypical chest pain and also has had difficulty with swallowing food.   Assessment & Plan    1. Positive troponin: ? NSTEMI with Trop 1.3 now 1.09. ECG shows inferior T wave changes. With cardiac risk factors including DM, HTN, remote tobacco, family history and troponin elevation with ECG findings, recommend definitive cath.  Will add on for later today, but will increase IV fluids to 100 cc NS, he has been on 75 cc/hr since last evening. Will check echo this am to assess LV fxn    2. CKD 3: Followed by Dr. Posey Pronto. Reported baseline Cr 1.73.  Cr today is 1.72, improved from 1.88 yesterday.  Will hydrate today; cath with limited contrast; no LV gram.  3. HTN: not well controlled. Was on lasix, bystolic, losartan HCT,. Currently on hold since admission with Cr and bradycardia.  Consider amlodipine 5 mg.   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  5. Hyperlipidemia:  With DM will need high potency statin with target LDL < 70.  6. Dysphagia: may ultimately need endoscopy ? Stricture.  Will schedule for echo this am; hydrate and plan for add-on cath later today. The risks and benefits of a cardiac  catheterization including,  but not limited to, death, stroke, MI, kidney damage and bleeding were discussed with the patient who indicates understanding and agrees to proceed.    Signed, Troy Sine, MD, [facility] 02/07/2017, 8:29 AM

## 2017-02-08 ENCOUNTER — Encounter (HOSPITAL_COMMUNITY): Payer: Self-pay | Admitting: Cardiology

## 2017-02-08 ENCOUNTER — Inpatient Hospital Stay (HOSPITAL_COMMUNITY): Payer: Medicare Other

## 2017-02-08 DIAGNOSIS — I2510 Atherosclerotic heart disease of native coronary artery without angina pectoris: Secondary | ICD-10-CM

## 2017-02-08 DIAGNOSIS — I639 Cerebral infarction, unspecified: Secondary | ICD-10-CM

## 2017-02-08 DIAGNOSIS — R1313 Dysphagia, pharyngeal phase: Secondary | ICD-10-CM

## 2017-02-08 DIAGNOSIS — R634 Abnormal weight loss: Secondary | ICD-10-CM

## 2017-02-08 DIAGNOSIS — R0789 Other chest pain: Secondary | ICD-10-CM

## 2017-02-08 DIAGNOSIS — R29898 Other symptoms and signs involving the musculoskeletal system: Secondary | ICD-10-CM

## 2017-02-08 DIAGNOSIS — R1310 Dysphagia, unspecified: Secondary | ICD-10-CM

## 2017-02-08 LAB — CBC
HCT: 28.8 % — ABNORMAL LOW (ref 39.0–52.0)
Hemoglobin: 9.8 g/dL — ABNORMAL LOW (ref 13.0–17.0)
MCH: 31.7 pg (ref 26.0–34.0)
MCHC: 34 g/dL (ref 30.0–36.0)
MCV: 93.2 fL (ref 78.0–100.0)
PLATELETS: 190 10*3/uL (ref 150–400)
RBC: 3.09 MIL/uL — ABNORMAL LOW (ref 4.22–5.81)
RDW: 12.7 % (ref 11.5–15.5)
WBC: 12.5 10*3/uL — AB (ref 4.0–10.5)

## 2017-02-08 LAB — BASIC METABOLIC PANEL
ANION GAP: 7 (ref 5–15)
BUN: 30 mg/dL — ABNORMAL HIGH (ref 6–20)
CALCIUM: 8.3 mg/dL — AB (ref 8.9–10.3)
CO2: 23 mmol/L (ref 22–32)
Chloride: 103 mmol/L (ref 101–111)
Creatinine, Ser: 1.58 mg/dL — ABNORMAL HIGH (ref 0.61–1.24)
GFR calc Af Amer: 47 mL/min — ABNORMAL LOW (ref >60)
GFR calc non Af Amer: 41 mL/min — ABNORMAL LOW (ref >60)
Glucose, Bld: 107 mg/dL — ABNORMAL HIGH (ref 65–99)
Potassium: 4.5 mmol/L (ref 3.5–5.1)
SODIUM: 133 mmol/L — AB (ref 135–145)

## 2017-02-08 LAB — GLUCOSE, CAPILLARY
GLUCOSE-CAPILLARY: 101 mg/dL — AB (ref 65–99)
Glucose-Capillary: 108 mg/dL — ABNORMAL HIGH (ref 65–99)
Glucose-Capillary: 149 mg/dL — ABNORMAL HIGH (ref 65–99)
Glucose-Capillary: 93 mg/dL (ref 65–99)
Glucose-Capillary: 88 mg/dL (ref 65–99)

## 2017-02-08 LAB — HEMOGLOBIN A1C
HEMOGLOBIN A1C: 4.9 % (ref 4.8–5.6)
Mean Plasma Glucose: 94 mg/dL

## 2017-02-08 MED ORDER — THIAMINE HCL 100 MG/ML IJ SOLN
100.0000 mg | Freq: Every day | INTRAMUSCULAR | Status: DC
  Administered 2017-02-09 – 2017-02-10 (×2): 100 mg via INTRAVENOUS
  Filled 2017-02-08 (×2): qty 2

## 2017-02-08 MED ORDER — LORAZEPAM 2 MG/ML IJ SOLN
INTRAMUSCULAR | Status: AC
  Administered 2017-02-08: 1 mg via INTRAVENOUS
  Filled 2017-02-08: qty 1

## 2017-02-08 MED ORDER — FOLIC ACID 1 MG PO TABS: 1.0000 mg | ORAL_TABLET | Freq: Every day | ORAL | Status: DC

## 2017-02-08 MED ORDER — HYDRALAZINE HCL 20 MG/ML IJ SOLN: 5.0000 mg | INTRAMUSCULAR | Status: DC | PRN

## 2017-02-08 MED ORDER — LORAZEPAM 1 MG PO TABS: 1.0000 mg | ORAL_TABLET | Freq: Four times a day (QID) | ORAL | Status: AC | PRN

## 2017-02-08 MED ORDER — HEPARIN SODIUM (PORCINE) 5000 UNIT/ML IJ SOLN
5000.0000 [IU] | Freq: Three times a day (TID) | INTRAMUSCULAR | Status: DC
  Administered 2017-02-08 – 2017-02-09 (×2): 5000 [IU] via SUBCUTANEOUS
  Filled 2017-02-08 (×2): qty 1

## 2017-02-08 MED ORDER — DEXTROSE-NACL 5-0.9 % IV SOLN
INTRAVENOUS | Status: DC
  Administered 2017-02-08 – 2017-02-09 (×2): via INTRAVENOUS

## 2017-02-08 MED ORDER — LORAZEPAM 2 MG/ML IJ SOLN
1.0000 mg | Freq: Once | INTRAMUSCULAR | Status: AC
  Administered 2017-02-08: 1 mg via INTRAVENOUS

## 2017-02-08 MED ORDER — LORAZEPAM 2 MG/ML IJ SOLN
1.0000 mg | Freq: Four times a day (QID) | INTRAMUSCULAR | Status: AC | PRN
  Administered 2017-02-08 – 2017-02-09 (×3): 1 mg via INTRAVENOUS
  Filled 2017-02-08 (×3): qty 1

## 2017-02-08 MED ORDER — ADULT MULTIVITAMIN W/MINERALS CH
1.0000 | ORAL_TABLET | Freq: Every day | ORAL | Status: DC
  Administered 2017-02-11 – 2017-02-12 (×2): 1 via ORAL
  Filled 2017-02-08 (×2): qty 1

## 2017-02-08 MED ORDER — SODIUM CHLORIDE 0.9 % IV SOLN
0.2000 ug/kg/h | INTRAVENOUS | Status: DC
  Filled 2017-02-08: qty 2

## 2017-02-08 MED ORDER — SODIUM CHLORIDE 0.9 % IV SOLN
INTRAVENOUS | Status: DC
  Administered 2017-02-09: 21:00:00 via INTRAVENOUS

## 2017-02-08 MED ORDER — VITAMIN B-1 100 MG PO TABS
100.0000 mg | ORAL_TABLET | Freq: Every day | ORAL | Status: DC
  Administered 2017-02-11 – 2017-02-12 (×2): 100 mg via ORAL
  Filled 2017-02-08 (×2): qty 1

## 2017-02-08 NOTE — Progress Notes (Signed)
Progress Note  Patient Name: Charles Hanson. Date of Encounter: 02/08/2017  Primary Cardiologist: Branch (NEW)  Subjective   No chest pain, going for swallow study today.  Inpatient Medications    Scheduled Meds: . amLODipine  5 mg Oral Daily  . aspirin EC  81 mg Oral Daily  . atorvastatin  80 mg Oral q1800  . dorzolamide  1 drop Both Eyes BID  . feeding supplement (GLUCERNA SHAKE)  237 mL Oral BID BM  . heparin subcutaneous  5,000 Units Subcutaneous Q8H  . insulin aspart  0-9 Units Subcutaneous Q4H  . isosorbide mononitrate  30 mg Oral QHS  . latanoprost  1 drop Both Eyes QHS  . sodium chloride flush  3 mL Intravenous Q12H  . traZODone  50 mg Oral QHS   Continuous Infusions: . sodium chloride 100 mL/hr at 02/08/17 0338  . sodium chloride     PRN Meds: sodium chloride, acetaminophen, nitroGLYCERIN, ondansetron (ZOFRAN) IV, sodium chloride flush   Vital Signs    Vitals:   02/07/17 1705 02/07/17 1900 02/08/17 0016 02/08/17 0500  BP: (!) 177/82 (!) 156/68 130/62 (!) 143/63  Pulse: 61  (!) 58 60  Resp: (!) 8 13 20 14   Temp:   98.7 F (37.1 C) 98.2 F (36.8 C)  TempSrc:   Oral Oral  SpO2: 100% 100% 96% 92%  Weight:    170 lb 12.8 oz (77.5 kg)  Height:        Intake/Output Summary (Last 24 hours) at 02/08/17 1207 Last data filed at 02/08/17 6063  Gross per 24 hour  Intake          2882.54 ml  Output               50 ml  Net          2832.54 ml   Filed Weights   02/06/17 1619 02/07/17 0444 02/08/17 0500  Weight: 165 lb 8 oz (75.1 kg) 166 lb 11.2 oz (75.6 kg) 170 lb 12.8 oz (77.5 kg)    Telemetry    SR - Personally Reviewed  ECG    N/A - Personally Reviewed  Physical Exam   General: Well developed, well nourished, male appearing in no acute distress. Head: Normocephalic, atraumatic.  Neck: Supple without bruits, JVD. Lungs:  Resp regular and unlabored, CTA. Heart: RRR, S1, S2, no S3, S4, or murmur; no rub. Abdomen: Soft, non-tender,  non-distended with normoactive bowel sounds. No hepatomegaly. No rebound/guarding. No obvious abdominal masses. Extremities: No clubbing, cyanosis, edema. Distal pedal pulses are 2+ bilaterally. Right radial cath site stable without hematoma, mild bruising.  Neuro: Alert and oriented X 3. Moves all extremities spontaneously. Noted weakness in the left hand.  Psych: Normal affect.  Labs    Chemistry Recent Labs Lab 02/06/17 1207 02/06/17 1330 02/07/17 0618 02/08/17 0350  NA 129*  --  133* 133*  K 4.1  --  4.1 4.5  CL 94*  --  99* 103  CO2 28  --  26 23  GLUCOSE 113*  --  80 107*  BUN 47*  --  36* 30*  CREATININE 1.88*  --  1.72* 1.58*  CALCIUM 9.4  --  8.6* 8.3*  PROT  --  5.9*  --   --   ALBUMIN  --  2.7*  --   --   AST  --  35  --   --   ALT  --  29  --   --  ALKPHOS  --  128*  --   --   BILITOT  --  1.0  --   --   GFRNONAA 33*  --  37* 41*  GFRAA 38*  --  42* 47*  ANIONGAP 7  --  8 7     Hematology Recent Labs Lab 02/06/17 1207 02/07/17 0618 02/08/17 0350  WBC 14.9* 12.6* 12.5*  RBC 3.83* 3.32* 3.09*  HGB 11.9* 10.3* 9.8*  HCT 34.9* 30.3* 28.8*  MCV 91.1 91.3 93.2  MCH 31.1 31.0 31.7  MCHC 34.1 34.0 34.0  RDW 12.6 12.4 12.7  PLT 210 188 190    Cardiac Enzymes Recent Labs Lab 02/07/17 0038 02/07/17 0618  TROPONINI 1.30* 1.09*    Recent Labs Lab 02/06/17 1226  TROPIPOC 0.89*     BNP Recent Labs Lab 02/06/17 1311  BNP 387.7*     DDimer No results for input(s): DDIMER in the last 168 hours.    Radiology    Dg Chest 2 View  Result Date: 02/06/2017 CLINICAL DATA:  Pt c/o feeling fatigue and having generalized weakness ongoing for "a while." Pt reports seen by PMD for same but not getting any answers. Pt also c/o burning sensation in the middle of his chest when he eats. EXAM: CHEST  2 VIEW COMPARISON:  None. FINDINGS: Normal mediastinum and cardiac silhouette. Normal pulmonary vasculature. No evidence of effusion, infiltrate, or pneumothorax.  No acute bony abnormality. Degenerative osteophytosis of the spine. IMPRESSION: No acute cardiopulmonary process. Electronically Signed   By: Suzy Bouchard M.D.   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  Prox LAD to Mid LAD lesion, 60 %stenosed - pre Diag, Mid LAD lesion, 50 %stenosed - post Diag (total length > 40 mm).  LV end diastolic pressure is mildly elevated.  There is no aortic valve stenosis.   The only really potential culprit lesion for troponin elevation in the setting of hypertension and no chest pain is the long combined lesion in the LAD.  Angiographically, it is possible that the lesions in series both before and after the diagonal branch could cause anterior ischemia, however the patient is not truly had angina.  I reviewed these images with Dr. Claiborne Billings, who felt that the best course of action would be to start with medical management using nitrates, Channel blockers and beta blockers to medically manage this moderate to severe disease. If symptoms persist, we could consider Myoview stress test to confirm or deny anterior ischemia.  This would allow for him to stabilize with hydration post catheterization.  Plan: Return to nursing unit for ongoing care  TR band removal per protocol.  The patient is significantly hypertensive which could be contributing to his elevated troponin. - Bystolic was discontinued secondary to bradycardia, may need to reassess restarting along with amlodipine.  I have added 30 mg Imdur.  PCI of the LAD would require a long stented segment that would jailed diagonal branch. It was the decision between both myself and Dr. Claiborne Billings, that this would not be a favorable option unless there was evidence of anterior ischemia. We decided to do this instead of an FFR, in an attempt to conserve contrast.     Patient Profile      76 y.o. male 56 yr h/o DM, CKD stage 3, recent difficulty with HTN control who was admitted with somewhat atypical chest  pain and also has had difficulty with swallowing food.   Assessment & Plan    1. Positive troponin: ECG showed inferior T wave changes. With cardiac risk factors including DM, HTN, remote tobacco, family history and troponin elevation with ECG findings, recommend definitive cath.   -- cath yesterday showed p/mLAD lesion that was >31mm in length. Decision was made to treat with medical therapy given the length of disease noted in the vessel. Imdur 30mg  was added. No further episodes of chest pain.   2. CKD 3: Followed by Dr. Posey Pronto. Reported baseline Cr 1.73.  Cr today is 1.58 post cath.  3. HTN: not well controlled. Was on lasix, bystolic, losartan HCT. Currently on hold since admission with Cr and bradycardia.  --on amlodipine 5 mg and Imdur 30mg . Continue to follow  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  5. Hyperlipidemia: Statin changed to Lipitor 80mg  this admission. Will need FLP/LFTs in 6 weeks.   6. Dysphagia: Primary team following. Planned for swallow study today. May ultimately need endoscopy ? Stricture.  7. Weakness: Reported left hand weakness that started about a week prior to admission. Notable on exam for upper extremity weakness. No slurred speech. No CT head on admission. Discussed this finding with primary team who will follow up.   Signed, Reino Bellis, NP  02/08/2017, 12:07 PM     Patient seen and examined. Agree with assessment and plan. I personally reviewed the cath angios with Dr. Ellyn Hack while patient was in the lab. Recommend an initial medical therapy trial for long LAD 60% stenosis. Will add isosorbide to regimen; amlodipine was added yesterday. Cr improved today with hydration. Will need evaluation for dysphagia.  Consider outpatient nuclear study in future on medical therapy to evaluate for LAD ischemia.   Pt has noted som left arm  weakness since Sunday which may have progressed; good left hand grip strength. For head CT today.      Troy Sine, MD, [facility] 02/08/2017 1:17 PM

## 2017-02-08 NOTE — Progress Notes (Signed)
TR band removed, site is level 1 with small amount bruising at site (slightly larger than size of a quarter).  No hematoma.  No complaints of tenderness/pain.  Pressure Dsg applied and pt was advised to leave in place for 24 hrs.  Will continue to monitor.

## 2017-02-08 NOTE — Progress Notes (Signed)
Resident called code stroke for left hand weakness.  Upon interviewing patient his left hand has been weak since Sunday.  His daughters noticed his hand "curling" yesterday after his cardiac cath.  They are concerned because it seems to be getting worse.  Dr Andria Frames at bedside to assess patient.  Plan stat MRI instead of head CT.  Daughters at bedside.  NIHSS 0, he does have some mild left hand weakness, and has trouble straightening his fingers.   He also complains of a mild to moderate HA which he says is getting better  1mg  Ativan given per orders for MRI  Patient transported to MRI

## 2017-02-08 NOTE — Progress Notes (Signed)
Called to floor concerning new onset left hand weakness after PT evaluation. Uncertain if this is brand-new given patient stating he has been experiencing this for the past few days. Daughter are unsure if this is new. My exam did not find vocal weakness in this area. Discussed with team, decided to call code stroke given opportunity for thrombolytic window. MRI/MRA ordered. -- Harriet Butte, [place], PGY-1

## 2017-02-08 NOTE — Progress Notes (Signed)
Family Medicine Teaching Service Daily Progress Note Intern Pager: [phone]  Patient name: Charles Hanson. Medical record number: [id number] Date of birth: 1941-06-30 Age: 75 y.o. Gender: male  Primary Care Provider: [facility] Consultants: Cardiology  Code Status: Full  Pt Overview and Major Events to Date:  04/15: Admit for NSTEMI, heparin gtt initiated, minor AKI 04/16: LHC mild-mod dz, proceed with medical management, consider Myoview if symptoms persist 04/17: GI consulted for dysphagia, new onset left hand grip weakness, code stroke called  Assessment and Plan: Charles Hanson. is a 76 y.o. male presenting with generalized weakness and chest pain. PMH is significant for HTN, HLD, CKDIII, NIDDM.  #Atypical chest pain  Inferior-NSTEMI  Bradycardia: Acute. EKG inferior NSTEMI, s.p heparin gtt. Sinus bradycardia persists but improved high 50s-60. Echo 60-65%, G2DD. [place] 4/16 showed mild-mod dz with rec for med management.  --Cardiology consulted, appreciate recommendations: Med management BB, CCB, nitrates, Bystolic d/c 2/2 bradycardia, consider restarting amlodipine, started Imdur 30 mg QD, Myoview if symptoms persist --ASA 81 mg QD, nitro PRN, Zofran PRN, O2 therapy to keep sat equal >92% --Lipitor 80 mg QD --Monitor BP and HR --Cardiac monitoring, continuous pulse ox  #Dysphasia: Subacute. Daughter noted 20 pound weight loss over 4 months. Patient is endorsing difficulty swallowing foods and liquids for the past several months. Patient failed SLP evaluation with fluids and liquids. --GI resulted, appreciate recommendations --MRI/MRA of head pending  #Left hand weakness: Acute versus subacute. Patient endorse generalized weakness on admission. Physical therapy evaluated patient 4/17, noting left hand weakness. Complete neuro exam pertinent for 3/5 grip strength on left hand, 5/5 upper extremity motor strength and 5/5 lower extremity strength bilaterally.  Daughters stating patient's speech is different. --Code stroke initated --MRI/MRA of head pending --PT/OT: home health PT  #Hypertension  Hyperlipidemia: Chronic. Stable. BP at 130-140/60s. On Lasix, Bystolic, and losartan-HCTZ at home. On statin therapy. --Bradycardia per above, holding antihypertensives given stability --Lipitor 80 mg QD, will likely need high-intensity statin on discharge --ASA 81 mg QD  #Acute kidney injury  Chronic kidney disease stage III: Chronic. Resolved. Baseline 1.73 per chart review. Creatinine clearance 32 mL/min. --Holding antihypertensives above given AKI --IVF NS@100mL /hr --Avoid nephrotoxic agents --Daily BMET  #Non-insulin-dependent diabetes mellitus: Chronic. Stable. On glimepiride at home. She only completed dose of prednisone for plantar fasciitis. CBGs remain controlled at 52s. A1c 4.9 during admission. --Sensitive sliding scale while on diet until MN, holding basal/bolus insulin --Holding glimepiride 2 mg QD --Diabetes coordinator suggesting discontinuing glimepiride on discharge given low CBGs  FEN/GI: HH/carb modified diet, IVF NS@100mL /hr Prophylaxis: Lovenox SQ  Disposition: Monitoring s/p LHC for recurrent symptoms. Or proceed with medical management unless chest pain or persists. Anticipate discharge home pending PT/OT recs.  Subjective:  Patient says chest burning has resolved. Denies shortness of breath. Continues to have some generalized weakness but unable to eat breakfast. Says breakfasts comes back up which has been a recent issue for him.  Objective: Temp:  [98.2 F (36.8 C)-98.8 F (37.1 C)] 98.2 F (36.8 C) (04/17 0500) Pulse Rate:  [47-67] 60 (04/17 0500) Resp:  [8-30] 14 (04/17 0500) BP: (130-204)/(62-82) 143/63 (04/17 0500) SpO2:  [92 %-100 %] 92 % (04/17 0500) Weight:  [170 lb 12.8 oz (77.5 kg)] 170 lb 12.8 oz (77.5 kg) (04/17 0500) Physical Exam: General: elderly male, well nourished, well developed, in no acute  distress with non-toxic appearance HEENT: normocephalic, atraumatic, moist mucous membranes Neck: supple, non-tender without lymphadenopathy, no JVD CV: bradycardic with regular rhythm  without murmurs, rubs, or gallops Lungs: clear to auscultation bilaterally with normal work of breathing on room air Abdomen: soft, non-tender, no masses or organomegaly palpable, normoactive bowel sounds Skin: warm, dry, no rashes or lesions, cap refill < 2 seconds Extremities: warm and well perfused, normal tone  Laboratory:  Recent Labs Lab 02/06/17 1207 02/07/17 0618 02/08/17 0350  WBC 14.9* 12.6* 12.5*  HGB 11.9* 10.3* 9.8*  HCT 34.9* 30.3* 28.8*  PLT 210 188 190    Recent Labs Lab 02/06/17 1207 02/06/17 1330 02/07/17 0618 02/08/17 0350  NA 129*  --  133* 133*  K 4.1  --  4.1 4.5  CL 94*  --  99* 103  CO2 28  --  26 23  BUN 47*  --  36* 30*  CREATININE 1.88*  --  1.72* 1.58*  CALCIUM 9.4  --  8.6* 8.3*  PROT  --  5.9*  --   --   BILITOT  --  1.0  --   --   ALKPHOS  --  128*  --   --   ALT  --  29  --   --   AST  --  35  --   --   GLUCOSE 113*  --  80 107*   Troponin: 0.89 > 1.30 > 1.09 UA: Protein 300 TSH: 1.798 (WNL) BNP: 387.7 (H) Hepatic panel: Alkaline phosphatase 128 (H), albumin 2.7 (L), total protein 5.9 (L) Lipase: 41 (WNL) Lipid panel: cholesterol 190 (WNL), TGY 88 (WNL), HDL 60 (WNL), LDL 112 (H)  Imaging/Diagnostic Tests: Left Heart Cath and Coronary Angiography (02/07/2017) IMPRESSION: 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 Prox LAD to Mid LAD lesion, 60 %stenosed - pre Diag, Mid LAD lesion, 50 %stenosed - post Diag (total length > 40 mm). LV end diastolic pressure is mildly elevated. There is no aortic valve stenosis. Best course of action would be to start with medical management using nitrates, Channel blockers and beta blockers to medically manage this moderate to severe disease. If symptoms persist, we could  consider Myoview stress test to confirm or deny anterior ischemia  PLAN: TR band removal per protocol. The patient is significantly hypertensive which could be contributing to his elevated troponin. Bystolic was discontinued secondary to bradycardia, may need to reassess restarting along with amlodipine. I have added 30 mg Imdur. PCI of the LAD would require a long stented segment that would jailed diagonal branch. It was the decision between both myself and Dr. Claiborne Billings, that this would not be a favorable option unless there was evidence of anterior ischemia. We decided to do this instead of an FFR, in an attempt to conserve contrast  Transthoracic Echocardiography (02/07/2017) CONCLUSION: Left ventricle: The cavity size was normal. Wall thickness was normal. Systolic function was normal. The estimated ejection fraction was in the range of 60% to 65%. Wall motion was normal; there were no regional wall motion abnormalities. Features are consistent with a pseudonormal left ventricular filling pattern, with concomitant abnormal relaxation and increased filling pressure (grade 2 diastolic dysfunction). Aortic valve: Mildly calcified annulus. Mildly thickened, mildly calcified leaflets. There was trivial regurgitation. Pulmonary arteries: Systolic pressure was mildly increased. PA peak pressure: 34 mm Hg (S).  DG Chest 2 View (02/06/2017) FINDINGS: Normal mediastinum and cardiac silhouette. Normal pulmonary vasculature. No evidence of effusion, infiltrate, or pneumothorax. No acute bony abnormality. Degenerative osteophytosis of the spine.  IMPRESSION: No acute cardiopulmonary process.  Centralia Bing, DO 02/08/2017, 7:06 AM  PGY-1, [place] Intern pager: [phone], text pages welcome

## 2017-02-08 NOTE — Progress Notes (Signed)
Inpatient Diabetes Program Recommendations  AACE/ADA: New Consensus Statement on Inpatient Glycemic Control (2015)  Target Ranges:  Prepandial:   less than 140 mg/dL      Peak postprandial:   less than 180 mg/dL (1-2 hours)      Critically ill patients:  140 - 180 mg/dL   Lab Results  Component Value Date   GLUCAP 108 (H) 02/08/2017   HGBA1C 4.9 02/06/2017    Review of Glycemic ControlResults for Charles Hanson, Charles Hanson (MRN [id number]) as of 02/08/2017 10:11  Ref. Range 02/07/2017 11:16 02/07/2017 17:36 02/07/2017 20:06 02/08/2017 00:37 02/08/2017 06:48  Glucose-Capillary Latest Ref Range: 65 - 99 mg/dL 65 49 (L) 123 (H) 101 (H) 108 (H)   Diabetes history: Type 2 diabetes Outpatient Diabetes medications: Amaryl 2 mg daily Current orders for Inpatient glycemic control:  Novolog sensitive q 4 hours  Inpatient Diabetes Program Recommendations:   Note that A1C is 4.9%.  Blood sugar low on admit.  Likely does not need Amaryl at discharge due to hypoglycemia and low A1C.   Thanks, Adah Perl, RN, BC-ADM Inpatient Diabetes Coordinator Pager [phone] (8a-5p)

## 2017-02-08 NOTE — Consult Note (Signed)
[facility] Consult: 1:33 PM 02/08/2017  LOS: 2 days    Referring Provider: Dr Andria Frames  Primary Care Physician:  [facility] Primary Gastroenterologist:  Althia Forts.      Reason for Consultation:  Dysphagia.     HPI: Charles Hanson. is a 76 y.o. male.  PMH HTN.  DM, on oral agents at home.  CKD, diabetic and atherosclerotic kidney dz, nephrotic syndrome, renal biopsy 09/2016.  Normocytic anemia.  Chronic foot pain from plantar fasciitis and possible peripheral neuropathy  limits his activity and he is generally sedentary.  He is primary caregiver for his wife who has dementia but is not bedridden. Patient has never undergone upper endoscopy or colonoscopy. Admitted 2 days ago with chest pain, elevated Troponins.  Intermittent sxs over several weeks; exertional and at rest, no triggers including food, position.  Duration several up to 12 hours.  No SOB or cough.   + dysphagia and regurgitation of food.  Marland Kitchen  LVEF 60 to 65%, grade 2 dCHF on 02/07/17 2D echo. 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  " PCI of the LAD... would not be a favorable option unless there was evidence of anterior ischemia".  Plan is for medical mgt.     Regarding his dysphagia. It dates back about 3 months. It is intermittent and unpredictable but consist of both liquid (including water) and solid or dry food.  Occurs ~ 3 times per week. He has adjusted his diet to mostly soft foods to accommodate. Food gets stuck at the level of his upper esophagus, it is not particularly painful. It regurgitates back up.  His dentition is not good but he still retains a significant portion of  his molars and denies mastication. In the last few months he's lost 20#.  Does not experience nausea and vomiting.  Experiences paroxysmal coughing productive of white, frothy, clear sputum.  His dysphagia has gotten to the point where today he is unable to swallow even liquids, which are regurgitating back up. He drinks vodka heavily, his daughter estimates that he drinks 13 to 16 ounces of vodka daily.  He's never had withdrawal symptoms or seizures.  Stools are always loose, at least 3 per day, never bloody or melenic.  RN has not seen any loose stools during admission.     Past Medical History:  Diagnosis Date  . Chronic kidney disease   . Hypertension     Past Surgical History:  Procedure Laterality Date  . fatty tissue removed from back of head    . LEFT HEART CATH AND CORONARY ANGIOGRAPHY N/A 02/07/2017   Procedure: Left Heart Cath and Coronary Angiography;  Surgeon: Leonie Man, MD;  Location: [place] [facility];  Service: Cardiovascular;  Laterality: N/A;  . NO PAST SURGERIES      Prior to Admission medications   Medication Sig Start Date End Date Taking? Authorizing Provider  Cholecalciferol (  VITAMIN D3) 2000 units TABS Take 2 tablets by mouth daily.   Yes Historical Provider, MD  dorzolamide (TRUSOPT) 2 % ophthalmic solution Place 1 drop into both eyes 2 (two) times daily.   Yes Historical Provider, MD  furosemide (LASIX) 20 MG tablet Take 20 mg by mouth every other day.    Yes Historical Provider, MD  glimepiride (AMARYL) 2 MG tablet Take 2 mg by mouth daily with breakfast.   Yes Historical Provider, MD  latanoprost (XALATAN) 0.005 % ophthalmic solution Place 1 drop into both eyes at bedtime.   Yes Historical Provider, MD  losartan-hydrochlorothiazide (HYZAAR) 100-25 MG tablet Take 1 tablet by mouth daily.   Yes Historical Provider, MD  meloxicam (MOBIC) 15 MG tablet Take 15 mg by mouth daily.   Yes Historical Provider, MD  nebivolol (BYSTOLIC) 10 MG tablet Take 10 mg by  mouth daily.   Yes Historical Provider, MD  OMEGA-3 FATTY ACIDS PO Take 2 capsules by mouth daily.    Yes Historical Provider, MD  predniSONE (DELTASONE) 20 MG tablet Take 20 mg by mouth 2 (two) times daily.   Yes Historical Provider, MD  simvastatin (ZOCOR) 20 MG tablet Take 20 mg by mouth daily.   Yes Historical Provider, MD    Scheduled Meds: . amLODipine  5 mg Oral Daily  . aspirin EC  81 mg Oral Daily  . atorvastatin  80 mg Oral q1800  . dorzolamide  1 drop Both Eyes BID  . feeding supplement (GLUCERNA SHAKE)  237 mL Oral BID BM  . heparin subcutaneous  5,000 Units Subcutaneous Q8H  . insulin aspart  0-9 Units Subcutaneous Q4H  . isosorbide mononitrate  30 mg Oral QHS  . latanoprost  1 drop Both Eyes QHS  . sodium chloride flush  3 mL Intravenous Q12H  . traZODone  50 mg Oral QHS   Infusions: . sodium chloride 100 mL/hr at 02/08/17 0338  . sodium chloride     PRN Meds: sodium chloride, acetaminophen, nitroGLYCERIN, ondansetron (ZOFRAN) IV, sodium chloride flush   Allergies as of 02/06/2017  . (No Known Allergies)    Family History  Problem Relation Age of Onset  . Cancer - Lung Mother   . Kidney disease Father   . Heart failure Father     Social History   Social History  . Marital status: Married    Spouse name: N/A  . Number of children: N/A  . Years of education: N/A   Occupational History  . Not on file.   Social History Main Topics  . Smoking status: Former Smoker    Packs/day: 2.00    Years: 15.00    Types: Cigarettes, Cigars    Quit date: 09/27/1965  . Smokeless tobacco: Former Systems developer    Types: Chew    Quit date: 09/27/1989  . Alcohol use 1.2 oz/week    2 Shots of liquor per week  . Drug use: No  . Sexual activity: Not on file   Other Topics Concern  . Not on file   Social History Narrative  . No narrative on file    REVIEW OF SYSTEMS: Constitutional:  No profound fatigue, some weakness and debilitation. ENT:  No nose bleeds Pulm:   Denies shortness of breath. CV:  No palpitations, no LE edema.  GU:  No hematuria, no frequency GI:  Per HPI. Heme:  No unusual or excessive bleeding or bruising. No history of anemia.   Transfusions:  None. Neuro: The pain in his foot has  a burning quality but it is also an aching quality as well. Does not describe any numbness to the touch.  Starting over the weekend, he's been unable to straighten several fingers in his left hand. MRI of the head is ordered for evaluation and to rule out stroke. He has not had any dysarthria. Derm:  No itching, no rash or sores.  Endocrine:  No sweats or chills.  No polyuria or dysuria Immunization:  Not queried. Travel:  None beyond local counties in last few months.    PHYSICAL EXAM: Vital signs in last 24 hours: Vitals:   02/08/17 0016 02/08/17 0500  BP: 130/62 (!) 143/63  Pulse: (!) 58 60  Resp: 20 14  Temp: 98.7 F (37.1 C) 98.2 F (36.8 C)   Wt Readings from Last 3 Encounters:  02/08/17 77.5 kg (170 lb 12.8 oz)  09/27/16 85.3 kg (188 lb)    General: Alert. Comfortable. Chronically ill appearing and somewhat frail WM. Head:  No facial asymmetry or swelling.  Eyes:  No scleral icterus, no conjunctival pallor. EOMI. Ears:  Slightly HOH.  Nose:  No congestion or discharge. Mouth:  Tongue midline. Oral mucosa pink, moist, clear. Dentition in poor repair but still has the bulk of his native teeth in place. However these are riddled with caries Neck:  No JVD, no thyromegaly, no masses. Lungs:  Diminished breath sounds but clear. No labored breathing or cough. Heart: RRR. No murmurs rubs gallops. S1, S2 present. Abdomen:  Soft. Not tender or distended. Bowel sounds normal timbre and active.   Rectal: Deferred   Musc/Skeltl: no gross joint contractures or deformities.  Extremities:  No CCE. Feet are warm.  Neurologic:  Patient is alert. Fully oriented times 3. No speech difficulties. Strength in the upper and lower extremities is full. He is  unable to straighten the lateral 3 fingers on his left hand. However grip strength is full bilaterally. Skin:  No suspicious lesions or sores. Some phlebotomy, IV access related bruising on the arms. Nodes:  No cervical adenopathy.   Psych:  Cooperative, pleasant, calm.  Intake/Output from previous day: 04/16 0701 - 04/17 0700 In: 3338.9 [P.O.:680; I.V.:2658.9] Out: 50 [Urine:50] Intake/Output this shift: Total I/O In: 50 [P.O.:50] Out: -   LAB RESULTS:  Recent Labs  02/06/17 1207 02/07/17 0618 02/08/17 0350  WBC 14.9* 12.6* 12.5*  HGB 11.9* 10.3* 9.8*  HCT 34.9* 30.3* 28.8*  PLT 210 188 190   BMET Lab Results  Component Value Date   NA 133 (L) 02/08/2017   NA 133 (L) 02/07/2017   NA 129 (L) 02/06/2017   K 4.5 02/08/2017   K 4.1 02/07/2017   K 4.1 02/06/2017   CL 103 02/08/2017   CL 99 (L) 02/07/2017   CL 94 (L) 02/06/2017   CO2 23 02/08/2017   CO2 26 02/07/2017   CO2 28 02/06/2017   GLUCOSE 107 (H) 02/08/2017   GLUCOSE 80 02/07/2017   GLUCOSE 113 (H) 02/06/2017   BUN 30 (H) 02/08/2017   BUN 36 (H) 02/07/2017   BUN 47 (H) 02/06/2017   CREATININE 1.58 (H) 02/08/2017   CREATININE 1.72 (H) 02/07/2017   CREATININE 1.88 (H) 02/06/2017   CALCIUM 8.3 (L) 02/08/2017   CALCIUM 8.6 (L) 02/07/2017   CALCIUM 9.4 02/06/2017   LFT  Recent Labs  02/06/17 1330  PROT 5.9*  ALBUMIN 2.7*  AST 35  ALT 29  ALKPHOS 128*  BILITOT 1.0  BILIDIR 0.2  IBILI 0.8   PT/INR Lab Results  Component Value Date   INR 1.11 02/07/2017   INR 0.86 09/27/2016   Hepatitis Panel No results for input(s): HEPBSAG, HCVAB, HEPAIGM, HEPBIGM in the last 72 hours. C-Diff No components found for: CDIFF Lipase     Component Value Date/Time   LIPASE 41 02/06/2017 1330    Drugs of Abuse  No results found for: LABOPIA, COCAINSCRNUR, LABBENZ, AMPHETMU, THCU, LABBARB   RADIOLOGY STUDIES: No results found.   IMPRESSION:   *  Dysphagia, intermittent, to solids and liquids,  occurring at level of the upper esophageal sphincter. Associated weight loss of 20#.  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  Troponin +.  Cath findings as per HPI, long segment LAD.  Plan medical mgt. And optimize control of HTN.     *  CKD 3  *  Alcohol abuse.  LFTs, coags, platelets normal.     *  Normocytic anemia.    *  Inability to extend lateral fingers in left hand.  MR brain: numerous small, acute infarcts in watershed/embolic pattern.  Background volume loss and chronic microvascular ischemia.     PLAN:     *  ? EGD vs esophagram?     Azucena Freed  02/08/2017, 1:33 PM Pager: [phone]  I have reviewed the entire case in detail with the above APP and discussed the plan in detail.  Therefore, I agree with the diagnoses recorded above. In addition,  I have personally interviewed and examined the patient and have personally reviewed any abdominal/pelvic CT scan images.  My additional thoughts are as follows:  Patient somnolent at this time due to Ativan given for MRI.  They report that he has great difficulty swallowing for about 3 months and getting worse in last few weeks. He has significant unintentional weight loss.  Nonsmoker, but heavy EtOH use.  I have ordered a barium swallow Xray for tomorrow.  Probable EGD to follow, I would just like a better idea what we may be dealing with and where it is.  Increased risk for EGD/sedation due to acute cardiac issues, but EGD almost certainly needs to be done this admission due to degree of symptoms.  Nelida Meuse III Pager [phone]  Mon-Fri 8a-5p [phone] after 5p, weekends, holidays

## 2017-02-08 NOTE — Progress Notes (Signed)
Pt daughter, Otho Perl, at bedside and concerned about several matters regarding pt:    #1-Pt has not been able to eat/swallow for the past month and this has caused a weight loss of about 20lbs, per family.  They are very concerned and would like this to be evaluated here in the hospital.    #2-Pt has been the primary caregiver for his wife who has advanced/severe dementia.  Currently wife is in private care.  Pt will not be able to safely continue caring for her.  #3-Pt may have mild dementia and Luann would like to know if he could get screened/tested for it.  She states that the forgetfulness began about 4 months ago and believes it continues to worsen.

## 2017-02-08 NOTE — Evaluation (Signed)
Physical Therapy Evaluation Patient Details Name: Drexler Maland. MRN: [id number] DOB: [birth year] Today's Date: 02/08/2017   History of Present Illness  Mars Scheaffer. is a 76 y.o. male presenting with generalized weakness and chest pain. Positive for NSTEMI.  PMH is significant for HTN, HLD, CKDIII, NIDDM.  Clinical Impression  Patient presents with decreased independence/safety with mobility due to deficits listed in PT problem list.  He demonstrates L side weakness and imbalance with fall risk per DGI (15/24, scores less than 19 demonstrate fall risk.)  He wll benefit from skilled PT in the acute setting to allow safe d/c home with family support and follow up HHPT.     Follow Up Recommendations Home health PT;Supervision - Intermittent    Equipment Recommendations  Other (comment) (TBA ?cane)    Recommendations for Other Services       Precautions / Restrictions Precautions Precautions: Fall      Mobility  Bed Mobility               General bed mobility comments: pt up in chair  Transfers Overall transfer level: Needs assistance Equipment used: None Transfers: Sit to/from Stand Sit to Stand: Min guard         General transfer comment: increased time due to having to straighten fingers on L hand to push up from chair  Ambulation/Gait Ambulation/Gait assistance: Min guard Ambulation Distance (Feet): 200 Feet Assistive device: None Gait Pattern/deviations: Step-through pattern;Decreased stride length;Drifts right/left;Wide base of support     General Gait Details: performed DGI for fall risk assessment, see in Balance section; pt walked without device and no LOB, slower speed and with increased time for turning  Stairs Stairs: Yes Stairs assistance: Min guard Stair Management: One rail Right;One rail Left;Step to pattern;Forwards;Backwards Number of Stairs: 3 General stair comments: pt used rail on R to ascend, cues to turn around on steps to descend,  but started down backwards, then assisted to turn to come down with L railing  Wheelchair Mobility    Modified Rankin (Stroke Patients Only) Modified Rankin (Stroke Patients Only) Pre-Morbid Rankin Score: No significant disability Modified Rankin: Moderately severe disability     Balance Overall balance assessment: Needs assistance   Sitting balance-Leahy Scale: Good       Standing balance-Leahy Scale: Good                   Standardized Balance Assessment Standardized Balance Assessment : Dynamic Gait Index   Dynamic Gait Index Level Surface: Mild Impairment Change in Gait Speed: Mild Impairment Gait with Horizontal Head Turns: Mild Impairment Gait with Vertical Head Turns: Mild Impairment Gait and Pivot Turn: Mild Impairment Step Over Obstacle: Moderate Impairment Step Around Obstacles: Normal Steps: Moderate Impairment Total Score: 15       Pertinent Vitals/Pain Pain Assessment: Faces Faces Pain Scale: Hurts little more Pain Location: bilateral feet with ambulation (reports plantar faciitis) Pain Descriptors / Indicators: Discomfort Pain Intervention(s): Monitored during session    Home Living Family/patient expects to be discharged to:: Private residence Living Arrangements: Spouse/significant other Available Help at Discharge: Family;Available PRN/intermittently Type of Home: House Home Access: Stairs to enter Entrance Stairs-Rails: Right Entrance Stairs-Number of Steps: 4 Home Layout: One level Home Equipment: Grab bars - tub/shower      Prior Function Level of Independence: Independent         Comments: was 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     Hand Dominance   Dominant Hand: Right    Extremity/Trunk Assessment   Upper Extremity Assessment Upper Extremity Assessment: LUE deficits/detail LUE  Deficits / Details: decreased as compared to R has to open fingers with R hand to use L to push up from chair, not formally tested, but noted deficits in function    Lower Extremity Assessment Lower Extremity Assessment: Generalized weakness       Communication   Communication: No difficulties  Cognition Arousal/Alertness: Awake/alert Behavior During Therapy: Anxious Overall Cognitive Status: Within Functional Limits for tasks assessed                                        General Comments      Exercises     Assessment/Plan    PT Assessment Patient needs continued PT services  PT Problem List Decreased strength;Decreased mobility;Decreased activity tolerance;Decreased balance;Decreased knowledge of use of DME;Decreased safety awareness       PT Treatment Interventions DME instruction;Gait training;Balance training;Stair training;Functional mobility training;Therapeutic exercise;Patient/family education;Therapeutic activities    PT Goals (Current goals can be found in the Care Plan section)  Acute Rehab PT Goals Patient Stated Goal: To return to independent PT Goal Formulation: With patient/family Time For Goal Achievement: 02/15/17 Potential to Achieve Goals: Good    Frequency Min 4X/week   Barriers to discharge        Co-evaluation               End of Session Equipment Utilized During Treatment: Gait belt Activity Tolerance: Patient tolerated treatment well Patient left: with call bell/phone within reach;in chair;with family/visitor present   PT Visit Diagnosis: Other abnormalities of gait and mobility (R26.89);Muscle weakness (generalized) (M62.81)    Time: 1610-9604 PT Time Calculation (min) (ACUTE ONLY): 29 min   Charges:   PT Evaluation $PT Eval Moderate Complexity: 1 Procedure PT Treatments $Gait Training: 8-22 mins   PT G CodesMagda Kiel, [place] [phone] 02/08/2017   Reginia Naas 02/08/2017, 12:27 PM

## 2017-02-08 NOTE — Consult Note (Signed)
Requesting Physician: Dr. Andria Frames    Chief Complaint: left hand decreased sensations  History obtained from:  Patient   Chart   HPI:                                                                                                                                         Charles Ida. is an 76 y.o. male who is an inpatient Charles Hanson after being admitted for chest pain. Today, after working with physical therapy, he apparently was discovered to have some left hand weakness and code stroke was activated as a result. However, after further discussion with the patient and family, it sounds as if symptoms of present for several days and code stroke was canceled. He was sent for MRI scan of the brain which ultimately revealed innumerable multifocal areas of ischemic infarction consistent with embolic stroke from a proximal source. Neurology consultation is now requested for further recommendations.  The patient reports that he first noticed some weakness in his left hand on 02/06/17. He states that he had difficulty straightening out his fingers. He feels like this is been constant and unchanged since he first noticed it. His daughter at the bedside says that she never noticed anything with his hand until today. On looking back at the chart, on the occasions where a neurologic examination was actually documented there does not appear to be any mention of focal weakness until today. He feels like the weakness is limited to his hand and fingers and denies any more proximal weakness in the left arm. He denies weakness in either leg or his right arm. He has not appreciated any facial droop, vision changes, difficulty speaking. He has had ongoing difficulty with swallowing which is been present for at least one month, though he states that he feels like it acutely worsened on 02/06/17 as well.   Date last known well: Date: 02/06/2017 Time last known well: Unable to determine tPA Given: No: Out of the  window   Past Medical History:  Diagnosis Date  . Chronic kidney disease   . Hypertension     Past Surgical History:  Procedure Laterality Date  . fatty tissue removed from back of head    . LEFT HEART CATH AND CORONARY ANGIOGRAPHY N/A 02/07/2017   Procedure: Left Heart Cath and Coronary Angiography;  Surgeon: Charles Man, MD;  Location: [facility];  Service: Cardiovascular;  Laterality: N/A;  . NO PAST SURGERIES      Family History  Problem Relation Age of Onset  . Cancer - Lung Mother   . Kidney disease Father   . Heart failure Father    Social History:  reports that he quit smoking about 51 years ago. His smoking use included Cigarettes and Cigars. He has a 30.00 pack-year smoking history. He quit smokeless tobacco use about 27 years ago. His smokeless tobacco use included Chew.  He reports that he drinks about 1.2 oz of alcohol per week . He reports that he does not use drugs.  Allergies: No Known Allergies  Medications:                                                                                                                           Scheduled: . amLODipine  5 mg Oral Daily  . aspirin EC  81 mg Oral Daily  . atorvastatin  80 mg Oral q1800  . dorzolamide  1 drop Both Eyes BID  . feeding supplement (GLUCERNA SHAKE)  237 mL Oral BID BM  . folic acid  1 mg Oral Daily  . heparin subcutaneous  5,000 Units Subcutaneous Q8H  . insulin aspart  0-9 Units Subcutaneous Q4H  . isosorbide mononitrate  30 mg Oral QHS  . latanoprost  1 drop Both Eyes QHS  . multivitamin with minerals  1 tablet Oral Daily  . sodium chloride flush  3 mL Intravenous Q12H  . thiamine  100 mg Oral Daily   Or  . thiamine  100 mg Intravenous Daily  . traZODone  50 mg Oral QHS    ROS:                                                                                                                                       As per history of present illness. The patient reports occasional cough.  Remainder of a 14 point review of systems is unremarkable.  Neurologic Examination:                                                                                                      Blood pressure (!) 196/79, pulse 60, temperature 98.2 F (36.8 C), temperature source Oral, resp. rate 16, height 5\' 8"  (1.727 m), weight 77.5 kg (170 lb 12.8 oz), SpO2 99 %.  HEENT-  normocephalic. His neck is supple without lymphadenopathy. Mucous membranes appear moist, oropharynx clear.  Claire anicteric. There is mild conjunctival injection. Cardiovascular- regular, no murmurs. No carotid bruits. Distal pulses 2+. Lungs- chest clear on anterior auscultation with no wheezing, rales. Abdomen- obese, soft, nontender. Bowel sounds normal.  Neurological Examination     Mental Status: Alert, oriented, thought content appropriate.  Speech fluent without evidence of aphasia.  Able to follow 3 step commands without difficulty. He seems to be slightly hard of hearing though this could be an attentional problem as well. Cranial Nerves: II: Discs flat bilaterally; Visual fields grossly normal,  III,IV, VI: ptosis not present, extra-ocular motions intact bilaterally, pupils equal, round, reactive to light and accommodation V,VII: smile symmetric, facial light touch sensation normal bilaterally VIII: hearing normal bilaterally IX,X: uvula rises symmetrically XI: bilateral shoulder shrug XII: midline tongue extension Motor: Right : Upper extremity   5/5    Left:     Upper extremity   4/5 in the extensors  Lower extremity   5/5     Lower extremity   5/5 Tone and bulk:normal tone throughout; no atrophy noted Sensory: Light touch is normal throughout.  Deep Tendon Reflexes: 2+ and symmetric. Plantars: Right: downgoing   Left: downgoing Cerebellar: normal finger-to-nose on the right, limited on the left by weakness.   Lab Results: BMP notable for sodium 133, glucose 107, BUN 30, creatinine 1.58 CBC notable for  white blood cell count 12.5, hemoglobin 9.8, hematocrit 28.8 Cholesterol 190, triglycerides 88, HDL 60, LDL 112 TSH 1.798  Imaging: I have personally and independently reviewed the MRI scan of the brain without contrast from today. This shows areas of restricted diffusion involving both cerebral hemispheres, both cerebellar hemispheres, and possibly the dorsolateral aspect of the left medulla. Most of these are punctate and subcentimeter in size, though there are some larger ones located in both frontal lobes. These are all consistent with acute ischemic infarctions, embolic in nature, presumably from a proximal source such as the heart or the aortic arch. There is a mild degree of chronic small vessel ischemic disease noted in the bihemispheric white matter. Age appropriate atrophy is present.  I have personally and independently reviewed the MRA of the head from today. The right vertebral artery is dominant. No flow-limiting stenosis is seen. There are no large vessel occlusions.  TTE from 02/09/17 showed normal left ventricle size and thickness with ejection fraction 60-65 percent. No regional wall motion abnormalities were seen. Grade 2 diastolic dysfunction was reported. There is mild calcification of the aortic valve annulus with mild thickening and calcification of the leaflets.  Impression/plan: 1. Acute Ischemic Stroke: This is an acute stroke involving numerous vascular territories both the posterior and anterior circulations. This is consistent with embolic stroke from a proximal source such as the heart or the aortic arch. It is certainly possible that at least some of the stroke may have occurred during his cardiac catheterization. However, he is reporting that he had some deficits on 4/15, prior to his procedure. Known risk factors for cerebrovascular disease in this patient include hypertension, coronary artery disease, age, dyslipidemia. Continue aspirin for secondary stroke prevention  once cleared to take oral medications. Continue statin with goal LDL less than 70. Ensure adequate glucose control. Avoid fever and hyperglycemia as these are associated with worse neurological recovery in the setting of stroke. Continue rehab services.   2. Left arm weakness: The main finding on his current examination is an upper motor neuron pattern of weakness in the left arm and hand, consistent with acute ischemic stroke. Continue  OT/rehabilitation.  3. 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 They have workup underway. It is certainly possible that his acute stroke exacerbated his underlying dysphagia, however. SLP/rehabilitation as needed.   This was discussed with the patient and his 3 children who are present at the bedside. They are in agreement with the plan as noted. They were given the opportunity to ask any questions and these were addressed to their satisfaction.  Thank you for this consultation. The stroke team will assume care of the patient beginning 02/09/17.

## 2017-02-09 ENCOUNTER — Inpatient Hospital Stay (HOSPITAL_COMMUNITY): Payer: Medicare Other

## 2017-02-09 DIAGNOSIS — F1020 Alcohol dependence, uncomplicated: Secondary | ICD-10-CM

## 2017-02-09 DIAGNOSIS — J988 Other specified respiratory disorders: Secondary | ICD-10-CM

## 2017-02-09 DIAGNOSIS — G40209 Localization-related (focal) (partial) symptomatic epilepsy and epileptic syndromes with complex partial seizures, not intractable, without status epilepticus: Secondary | ICD-10-CM

## 2017-02-09 DIAGNOSIS — I6310 Cerebral infarction due to embolism of unspecified precerebral artery: Secondary | ICD-10-CM

## 2017-02-09 DIAGNOSIS — R1310 Dysphagia, unspecified: Secondary | ICD-10-CM

## 2017-02-09 DIAGNOSIS — I6340 Cerebral infarction due to embolism of unspecified cerebral artery: Secondary | ICD-10-CM | POA: Insufficient documentation

## 2017-02-09 LAB — BASIC METABOLIC PANEL
Anion gap: 7 (ref 5–15)
BUN: 21 mg/dL — ABNORMAL HIGH (ref 6–20)
CO2: 22 mmol/L (ref 22–32)
CREATININE: 1.51 mg/dL — AB (ref 0.61–1.24)
Calcium: 8.3 mg/dL — ABNORMAL LOW (ref 8.9–10.3)
Chloride: 105 mmol/L (ref 101–111)
GFR calc non Af Amer: 43 mL/min — ABNORMAL LOW (ref >60)
GFR, EST AFRICAN AMERICAN: 50 mL/min — AB (ref >60)
Glucose, Bld: 207 mg/dL — ABNORMAL HIGH (ref 65–99)
Potassium: 3.8 mmol/L (ref 3.5–5.1)
Sodium: 134 mmol/L — ABNORMAL LOW (ref 135–145)

## 2017-02-09 LAB — GLUCOSE, CAPILLARY
GLUCOSE-CAPILLARY: 127 mg/dL — AB (ref 65–99)
GLUCOSE-CAPILLARY: 136 mg/dL — AB (ref 65–99)
GLUCOSE-CAPILLARY: 142 mg/dL — AB (ref 65–99)
GLUCOSE-CAPILLARY: 206 mg/dL — AB (ref 65–99)
Glucose-Capillary: 169 mg/dL — ABNORMAL HIGH (ref 65–99)
Glucose-Capillary: 144 mg/dL — ABNORMAL HIGH (ref 65–99)

## 2017-02-09 LAB — MRSA PCR SCREENING: MRSA by PCR: NEGATIVE

## 2017-02-09 LAB — CBC
HEMATOCRIT: 29.3 % — AB (ref 39.0–52.0)
Hemoglobin: 10 g/dL — ABNORMAL LOW (ref 13.0–17.0)
MCH: 31.1 pg (ref 26.0–34.0)
MCHC: 34.1 g/dL (ref 30.0–36.0)
MCV: 91 fL (ref 78.0–100.0)
Platelets: 162 10*3/uL (ref 150–400)
RBC: 3.22 MIL/uL — AB (ref 4.22–5.81)
RDW: 12.3 % (ref 11.5–15.5)
WBC: 15.9 10*3/uL — ABNORMAL HIGH (ref 4.0–10.5)

## 2017-02-09 LAB — MAGNESIUM: Magnesium: 1.4 mg/dL — ABNORMAL LOW (ref 1.7–2.4)

## 2017-02-09 LAB — TROPONIN I
TROPONIN I: 7.6 ng/mL — AB (ref <0.03)
Troponin I: 7.96 ng/mL (ref <0.03)
Troponin I: 9.32 ng/mL (ref <0.03)

## 2017-02-09 MED ORDER — VALPROATE SODIUM 500 MG/5ML IV SOLN
1000.0000 mg | Freq: Once | INTRAVENOUS | Status: AC
Start: 2017-02-09 — End: 2017-02-09
  Administered 2017-02-09: 1000 mg via INTRAVENOUS
  Filled 2017-02-09: qty 10

## 2017-02-09 MED ORDER — METOPROLOL TARTRATE 5 MG/5ML IV SOLN
2.5000 mg | INTRAVENOUS | Status: DC
  Administered 2017-02-09 – 2017-02-11 (×12): 2.5 mg via INTRAVENOUS
  Filled 2017-02-09 (×12): qty 5

## 2017-02-09 MED ORDER — VALPROATE SODIUM 500 MG/5ML IV SOLN
500.0000 mg | Freq: Two times a day (BID) | INTRAVENOUS | Status: DC
  Administered 2017-02-09 – 2017-02-10 (×2): 500 mg via INTRAVENOUS
  Filled 2017-02-09 (×2): qty 5

## 2017-02-09 MED ORDER — LORAZEPAM 2 MG/ML IJ SOLN
INTRAMUSCULAR | Status: AC
  Administered 2017-02-09: 10:00:00
  Filled 2017-02-09: qty 1

## 2017-02-09 MED ORDER — FOLIC ACID 5 MG/ML IJ SOLN
1.0000 mg | Freq: Every day | INTRAMUSCULAR | Status: DC
  Administered 2017-02-09 – 2017-02-12 (×3): 1 mg via INTRAVENOUS
  Filled 2017-02-09 (×5): qty 0.2

## 2017-02-09 MED ORDER — MAGNESIUM SULFATE 2 GM/50ML IV SOLN
2.0000 g | Freq: Once | INTRAVENOUS | Status: AC
  Administered 2017-02-09: 2 g via INTRAVENOUS
  Filled 2017-02-09: qty 50

## 2017-02-09 MED ORDER — METOPROLOL TARTRATE 5 MG/5ML IV SOLN: 5.0000 mg | Freq: Two times a day (BID) | INTRAVENOUS | Status: DC

## 2017-02-09 MED ORDER — METOPROLOL TARTRATE 25 MG PO TABS: 12.5000 mg | ORAL_TABLET | Freq: Two times a day (BID) | ORAL | Status: DC

## 2017-02-09 MED ORDER — HEPARIN (PORCINE) IN NACL 100-0.45 UNIT/ML-% IJ SOLN
1150.0000 [IU]/h | INTRAMUSCULAR | Status: DC
  Administered 2017-02-09: 1150 [IU]/h via INTRAVENOUS
  Filled 2017-02-09: qty 250

## 2017-02-09 NOTE — Progress Notes (Signed)
FPTS Interim Progress Note  S: Paged by RN. Patient's most recent neurological check is different that before. Went to bedside. Patient's eyes are deviating towards the left, he is unable to look midline or to the right at all. This is new for him. He is very drowsy with mumbled speech, and not moving his extremities as well as before. He is getting PRN Ativan per CIWA protocol (is an alcoholic) and his last dose was 1 mg at 2235. At bedside, patient does follow some commands such as opening his eyes and wiggling his toes.   O: BP (!) 191/104   Pulse (!) 111   Temp 98 F (36.7 C) (Axillary)   Resp 16   Ht 5\' 8"  (1.727 m)   Wt 170 lb 12.8 oz (77.5 kg)   SpO2 97%   BMI 25.97 kg/m   General: Elderly gentleman laying in bed with eyes closed Resp: Normal work of breathing Neuro: Sleepy, awakes to shaking or sternal rub. Mumbled speech when asked orientation questions, unable to detect what he is saying. PERRLA. Eyes deviating towards left, unable to look midline or to the right. Does not move extremities on command. Is able to wiggle toes.  A/P: Charles Hanson is a 76 yo male admitted for NSTEMI, s/p cardiac cath on 4/16, found to have new strokes in anterior and posterior circulations on 4/17 MRI. Appears to have worsening neurological status. His drowsiness could certainly be due to Ativan, however I would not expect him to have eye deviation from this. Called on call neurologist Dr. Nicole Kindred to discuss further. He recommended getting STAT CT Head w/o contrast. Appreciate recommendations from neurology and excellent RN care. Will follow up with Head CT read and follow closely.   Carlyle Dolly, MD 02/09/2017, 1:07 AM PGY-2, [facility] pager [phone]

## 2017-02-09 NOTE — Progress Notes (Signed)
PT Cancellation Note  Patient Details Name: Charles Hanson. MRN: [id number] DOB: 27-Oct-1940   Cancelled Treatment:    Reason Eval/Treat Not Completed: Medical issues which prohibited therapy;Patient not medically ready (Pt with possible extension of stroke and plans to transfer to ICU he is not medically stable for therapy at this time.)   Blondie Riggsbee J Stann Mainland 02/09/2017, 10:13 AM  Governor Rooks, PTA pager [phone]

## 2017-02-09 NOTE — Progress Notes (Signed)
OT Cancellation Note  Patient Details Name: Charles Hanson. MRN: [id number] DOB: 09-02-1941   Cancelled Treatment:    Reason Eval/Treat Not Completed: Medical issues which prohibited therapy (pt wih changes in neuro status; RN requesting hold). Will follow up for OT eval as time allows.  Binnie Kand M.S., OTR/L Pager: [phone]  02/09/2017, 8:27 AM

## 2017-02-09 NOTE — Progress Notes (Signed)
Called report to Gerline Legacy RN on 4N. Family at bedside aware of transfer to 4N18. Family took phone and glasses with them. Carroll Kinds RN

## 2017-02-09 NOTE — Progress Notes (Signed)
Covering resident paged regarding patient's change of mental status from this afternoon. Patient unable to swallow, placed NPO, current BG 88. Patient fluids switched to D5.9 @ 125. MD OK with not getting EKG that was ordered early afternoon with code stroke. NIHSS scored at 5, CIWA scored at 9. MD advised to give Ativan even with recent stroke findings for management of D/T's. RN addressed additional findings that may be contributing to patient's agitation, will continue to toilet patient and reposition for comfort. Patient unable to receive Tylenol at this time due to NPO status. Hydralazine PRN ordered with parameters.

## 2017-02-09 NOTE — Progress Notes (Signed)
STROKE TEAM PROGRESS NOTE   SUBJECTIVE (INTERVAL HISTORY) His 2 daughters are at the bedside.  Family had noted L hand weakness on the way to the hospital with him holding his L hand. Odd symptoms - with him disoriented with anxiety - 1-2 weeks prior to admission. He refused to go to the hospital. What was described as disoriented sounds like weakness and possible stroke symptoms that were not recognized. Pt in AF now - possible AF caused strokes. Now worse, ? Secondary to new strokes from AF. He had repeat CT scan of the head this morning which did not show any new strokes or hemorrhage. Patient is having intermittent right facial twitchings during exam. Stat EEG done at the bedside shows no frank seizure activity but excessive motion artifacts. Patient was was given Ativan 0.5 mg and facial twitchings stopped   OBJECTIVE Temp:  [97.7 F (36.5 C)-98.7 F (37.1 C)] 97.7 F (36.5 C) (04/18 0727) Pulse Rate:  [72-111] 98 (04/18 0732) Cardiac Rhythm: Sinus tachycardia (04/18 0738) Resp:  [16-20] 18 (04/18 0727) BP: (148-196)/(72-108) 177/97 (04/18 0732) SpO2:  [94 %-99 %] 96 % (04/18 0732) Weight:  [77.1 kg (170 lb)-78.1 kg (172 lb 3.2 oz)] 78.1 kg (172 lb 3.2 oz) (04/18 0437)  CBC:  Recent Labs Lab 02/08/17 0350 02/09/17 0445  WBC 12.5* 15.9*  HGB 9.8* 10.0*  HCT 28.8* 29.3*  MCV 93.2 91.0  PLT 190 503    Basic Metabolic Panel:  Recent Labs Lab 02/08/17 0350 02/09/17 0445  NA 133* 134*  K 4.5 3.8  CL 103 105  CO2 23 22  GLUCOSE 107* 207*  BUN 30* 21*  CREATININE 1.58* 1.51*  CALCIUM 8.3* 8.3*  MG  --  1.4*    PHYSICAL EXAM Frail elderly Caucasian male. who is restless and having intermittent right facial twitchings. . Afebrile. Head is nontraumatic. Neck is supple without bruit.    Cardiac exam no murmur or gallop. Lungs are clear to auscultation. Distal pulses are well felt. Neurological Exam :  Patient is lethargic but can be aroused to follow commands. He has left  gaze preference and will not follow my eyes to the right. He blinks to threat on the right right partially and not on the left. Pupils are small and sluggishly reactive. Fundi could not be visualized. Hie has mild left lower facial weakness. Tongue is midline. Patient will follow some midline as well as commands on the right side but not on the left. He has dense left hemiplegia with only trace withdrawal to pain in the left lower extremity and left hand. He has purposeful antigravity movements on the right. Deep tendon reflexes are depressed on the left present on the right. Left plantar upgoing right downgoing. Sensation appears preserved on the right hand may be diminished on the left. Gait not tested. ASSESSMENT/PLAN [name]. is a 76 y.o. male with history of progressive L hand weakness, admitted with CP s/p cardiac cath who was noted to have L hand weakness by PT. He did not receive IV t-PA due to unknown LKW.   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   Infarct likely present PTA, may be up to 37-35 weeks old. Additional infarcts in hospital with neuro worsening during the night  Resultant  Encephalopathy, L hemiparesis  MRI  B Cerebellar and B cerebral embolic Infarcts. And changes of  small vessel disease.   MRA Unremarkable   CT head  after neuro worsening no acute abnormalitites. small vessel disease. Atrophy  Carotid Doppler  pending   2D Echo  EF 60-65%. No source of embolus   LDL 112  HgbA1c 4.9  Heparin 5000 units sq tid for VTE prophylaxis  Diet NPO time specified  No antithrombotic prior to admission, now on aspirin 81 mg daily  Therapy recommendations:  HH therapies - will need to reassess once stable  Disposition:  pending  (lives w/ wife who has Alzheimers, he helps provide care)  Possible atrial fibrillation  Strips reviewed  Discussed with resident who will further assess  Ok for IV  heparin if needed, no bolus  R Facial Twitching, Possible seizure  New this am  Ativan 0.5 mg now with resolved twitching  depacon 1gm IV now followed by 500 bid  Check EEG  Hypertension  Noncompliant with meds PTA, not taking consistently  Stable  Permissive hypertension (OK if < 220/120) but gradually normalize in 5-7 days  Long-term BP goal normotensive  Hyperlipidemia  Home meds:  zocor 20, resumed in hospital  LDL 112 goal  Consider increasing  Continue statin at discharge  ETOH abuse, withdrawal  On CIWA protocol  Other Stroke Risk Factors  Advanced age  Former Cigarette smoker  UDS / ETOH screen not performed  Other Active Problems  Atypical CP, inferior-NSTEMI  AKI, CKD stage III  Dr. Leonie Man discussed diagnosis, prognosis,  treatment options and plan of care with pt, daughters, RN and Dr. Yisroel Ramming. and  Dr Halford Chessman. I have personally examined this patient, reviewed notes, independently viewed imaging studies, participated in medical decision making and plan of care.ROS completed by me personally and pertinent positives fully documented  I have made any additions or clarifications directly to the above note. Agree with note above. Marland Kitchen He presented with subacute right hemispheric infarcts couple of days prior to presentation and had neurological worsening following cardiac catheterization with MRI showing bi-cerebral embolic infarcts. Overnight his head further neurological worsening and looks like he is in new onset atrial fibrillation. He is also having facial twitchings which may represent partial seizures. Patient was given Ativan followed by IV Depacon for seizures and start EEG has been obtained. Patient will be transferred to the intensive care unit for closer monitoring of his cardiac and neurological status. I had a long discussion with the patient and his 2 daughters at the bedside as well as with the medical resident from family practice service and Dr. Halford Chessman  and answered questions. This patient is critically ill and at significant risk of neurological worsening, death and care requires constant monitoring of vital signs, hemodynamics,respiratory and cardiac monitoring, extensive review of multiple databases, frequent neurological assessment, discussion with family, other specialists and medical decision making of high complexity.I have made any additions or clarifications directly to the above note.This critical care time does not reflect procedure time, or teaching time or supervisory time of PA/NP/Med Resident etc but could involve care discussion time.  I spent 40 minutes of neurocritical care time  in the care of  this patient.      Antony Contras, MD Medical Director [facility] Pager: [phone] 02/09/2017 2:12 PM   Hospital day # [place] for Pager information 02/09/2017 1:34 PM   To contact Stroke Continuity provider, please refer to [email or web address] After hours, contact General Neurology

## 2017-02-09 NOTE — Progress Notes (Signed)
Head CT resulted, Dr. Nicole Kindred from neurology paged. MD made aware of patient's neurological status and results of Head CT. Thiamine had not been given during the previous shift. MD ordered for IV thiamine to be given now.

## 2017-02-09 NOTE — Progress Notes (Signed)
Covering resident paged, updated on patient's neurological status. MD OK with patient receiving Ativan as well as subQ heparin. Patient currently sleeping, HR in the 80's. Will reassess CIWA as needed, will be available if patient has episode of restlessness/agitation.

## 2017-02-09 NOTE — Progress Notes (Signed)
Notified Dr. Sheila Oats of Trop 7.6. Carroll Kinds RN

## 2017-02-09 NOTE — Progress Notes (Signed)
FPTS Interim Progress Note  S: Upon re-evaluation, patient has not improved. His troponin increased to 7.6 from 1.09. GCS is 7 for me (does not open eyes, withdraws to pain, incomprehensible sounds). He is protecting his airway currently. EKG is ordered and will be done soon. Patient had an EEG done earlier this morning.   Called CCM, will see patient.   Smiley Houseman, MD 02/09/2017, 12:20 PM PGY-2, [facility] pager [phone]

## 2017-02-09 NOTE — Progress Notes (Signed)
Notified Dr. Dallas Schimke of runs of vtach from previously this am. New orders to follow. Carroll Kinds RN

## 2017-02-09 NOTE — Care Management Note (Signed)
Case Management Note  Patient Details  Name: Charles Hanson. MRN: [id number] Date of Birth: 03/15/1941  Subjective/Objective:  Pt presented for Chest Pain-positive troponin. 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 Pt with a Hx of ETOH Abuse on CIWA protocol. PTA-Noncompliant with medication regimen. Pt with worsening neurological status. Plan will be to transfer to ICU.                    Action/Plan: CM will continue to monitor. Initial plans were for home with [facility]. Pt will need to have PT to reevaluate once stable for future recommendations.   Expected Discharge Date:                  Expected Discharge Plan:  [facility]  In-House Referral:     Discharge planning Services  CM Consult  Post Acute Care Choice:    Choice offered to:     DME Arranged:    DME Agency:     HH Arranged:    [facility] Agency:     Status of Service:  In process, will continue to follow  If discussed at Long Length of Stay Meetings, dates discussed:    Additional Comments:  Bethena Roys, RN 02/09/2017, 9:56 AM

## 2017-02-09 NOTE — Progress Notes (Signed)
EEG completed; results pending.    

## 2017-02-09 NOTE — Plan of Care (Signed)
Problem: Skin Integrity: Goal: Risk for impaired skin integrity will decrease Outcome: Not Progressing New bruising/pressure injury to elbows noted, foam pieces applied BL.  Problem: Education: Goal: Knowledge of disease or condition will improve Outcome: Not Progressing Patient unable to be educated at this point due to mental status, family member at bedside educated regarding patient's status.

## 2017-02-09 NOTE — Progress Notes (Addendum)
Covering resident repaged regarding continued decline of patient's neuro status. Ativan had been given at 2230. Most significant finding was patient's eye deviating left with inability to move head/eyes to the right. Resident paged Neurology, order placed for Head CT. RN down to head CT with patient. Periods of lethargy but when uncomfortable with move extremities, still noted weakness on Left side. Nonblanchable/deep tissue injury to left elbow from frequent rubbing and sheer while patient has been agitated and moving in bed. Foam elbow pads applied BL.

## 2017-02-09 NOTE — Progress Notes (Signed)
Notified Reino Bellis NP of pt run of vtach. Will be to see pt. Continue to monitor. Carroll Kinds RN

## 2017-02-09 NOTE — Progress Notes (Signed)
SLP Cancellation Note  Patient Details Name: Charles Hanson. MRN: [id number] DOB: 11/30/1940   Cancelled treatment:       Reason Eval/Treat Not Completed: Medical issues which prohibited therapy; hold per Dr. Leonie Man.  Will follow for appropriateness.    Juan Quam Laurice 02/09/2017, 9:52 AM

## 2017-02-09 NOTE — Progress Notes (Signed)
FPTS Interim Progress Note  S: Received call from Dr. Leonie Man, neurology who reports that patient is having episodes of rapid heart rate, intermittently going into Atrial Fibrillation, having runs of Vtach and SVT. Neurology reports of worsening neurological deficits (worsening Left hemiplegia). Dr. Leonie Man believes patient may be having seizures (twitching in the corner of his mouth). Gave ativan and symptoms resolved. Patient was also started on Keppra.   O: BP (!) 177/97   Pulse 98   Temp 97.7 F (36.5 C) (Axillary)   Resp 18   Ht 5\' 8"  (1.727 m)   Wt 78.1 kg (172 lb 3.2 oz)   SpO2 96%   BMI 26.18 kg/m   GEN: NAD, does not follow commands  HEENT: left ward eye deviation  CV: tachycardia, regular rhythm, no murmurs, rubs, or gallops PULM: CTAB, normal effort,is protecting airway  SKIN: No rash or cyanosis; warm and well-perfused EXTR: No lower extremity edema. Moves lower extremities spontaneously. Moves right arm spontaneously but does not follow requests. Does not move left arm PSYCH: Mood and affect euthymic, normal rate and volume of speech NEURO: Awake, able to tell his name but does not answer other questions.    A/P: EKG and troponin Called Cardiology, and will see patient  Discussed with CCM who recommends waiting for cardiology recommendations.    Smiley Houseman, MD 02/09/2017, 9:59 AM PGY-2, [facility] pager [phone]

## 2017-02-09 NOTE — Consult Note (Signed)
PULMONARY / CRITICAL CARE MEDICINE   Name: Charles Hanson. MRN: [id number] DOB: 1940-12-10    ADMISSION DATE:  02/06/2017 CONSULTATION DATE:  02/09/2017  REFERRING MD:  Dr. Andria Frames  CHIEF COMPLAINT:  Altered mental status  HISTORY OF PRESENT ILLNESS:  76 year old Male admitted on 4/15 with generalized weakness and chest pain.  Information obtained from medical chart review.  Patient has a medical history of former smoker (quit 6 years ago), HTN, CKD stage 3, current ETOH abuse, HLD, and NIDDM.   He presented with a 10 day history of progressive generalized weakness, fatigue, and atypical chest pain.  Chest pain occurred with swallowing food, described as a burning sensation, and that food would get stuck resulting in him vomiting the food back up.  This has been progressive over the last 3 months with family estimating a 20 lb weight loss  His fatigue was accompanied with a decrease in activity due to the pain in his feet in which he received a steroid injection by a podiatrist 3 days prior to admit. Additionally, he had just started taking bystolic for HTN although his family admits he only takes the medicine his doctor gives him for free.    On admit, he was found to be bradycardic and hypertensive with positive troponin's and EKG changes c/w NSTEMI with inferior lead involvement.  He was placed on heparin gtt.  Cardiology consulted and he was taken for a LHC on 4/16.  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  GI also consulted for evaluation of his dysphasia.    On 4/17, the patient was found to have new onset left hand weakness.  A code stroke was called and MRI obtained. NIHSS was 0.  It was some debate over if this left hand weakness was new or ongoing since 4/15  according to the family. TPA was deferred given the uncertainty of LWN time.  MRI revealed innumerable  multifocal areas of ischemic infarction consistent with watershed embolic stroke.    Overnight on 4/17, his neurological status continued to deteriorate which was somewhat obscured by the ativan he was receiving per the CIWA protocol.  He was lethargic but following commands but found to have a left gaze, right neglect.  He additionally developed intermittent right facial twitching.  EEG did not indicate seizure activity but exam limited due to excessive motion artifacts; Depacon was started. 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    PCCM called for concern for airway protection and monitoring.  PAST MEDICAL HISTORY :  He  has a past medical history of Chronic kidney disease and Hypertension.  PAST SURGICAL HISTORY: He  has a past surgical history that includes No past surgeries; fatty tissue removed from back of head; and Left Heart Cath and Coronary Angiography (N/A, 02/07/2017).  No Known Allergies  No current facility-administered medications on file prior to encounter.    No current outpatient prescriptions on file prior to encounter.    FAMILY HISTORY:  His indicated that his mother is deceased. He indicated that his father is deceased.    SOCIAL HISTORY: He  reports that he quit smoking about 51 years ago. His smoking use included Cigarettes and Cigars. He has a 30.00 pack-year smoking history. He quit smokeless tobacco use about 27 years ago. His smokeless tobacco use included Chew. He  reports that he drinks about 1.2 oz of alcohol per week . He reports that he does not use drugs.  REVIEW OF SYSTEMS:  Unable to obtain as patient is lethargic and unable to answer questions.   SUBJECTIVE:   VITAL SIGNS: BP (!) 177/97   Pulse 98   Temp 97.7 F (36.5 C) (Axillary)   Resp 18   Ht 5\' 8"  (1.727 m)   Wt 172 lb 3.2 oz (78.1 kg)   SpO2 96%   BMI 26.18 kg/m   HEMODYNAMICS:    VENTILATOR SETTINGS:    INTAKE /  OUTPUT: I/O last 3 completed shifts: In: 9379 [P.O.:250; I.V.:3900] Out: 700 [Urine:700]  PHYSICAL EXAMINATION: General:  Elderly male lying in bed in NAD Neuro:  Intermittently agitated, awakens to verbal, intermittently follows commands, moves right side spontaneously, flicker with slight spontaneous movement on left leg, none in LUE,  verbalizes only asking for water otherwise incoherent speech HEENT:  Pupils 3/=, scleral anicteric, no JVD Cardiovascular:  IR RR, no m/r/g  Lungs:  Even/ non-labored respirations, clear lung sounds throughout Abdomen:  Soft, non-distended, +bowel sounds Skin:  Dressing to left elbow, otherwise skin warm/dry  LABS:  BMET  Recent Labs Lab 02/07/17 0618 02/08/17 0350 02/09/17 0445  NA 133* 133* 134*  K 4.1 4.5 3.8  CL 99* 103 105  CO2 26 23 22   BUN 36* 30* 21*  CREATININE 1.72* 1.58* 1.51*  GLUCOSE 80 107* 207*    Electrolytes  Recent Labs Lab 02/07/17 0618 02/08/17 0350 02/09/17 0445  CALCIUM 8.6* 8.3* 8.3*  MG  --   --  1.4*    CBC  Recent Labs Lab 02/07/17 0618 02/08/17 0350 02/09/17 0445  WBC 12.6* 12.5* 15.9*  HGB 10.3* 9.8* 10.0*  HCT 30.3* 28.8* 29.3*  PLT 188 190 162    Coag's  Recent Labs Lab 02/07/17 0618  INR 1.11    Sepsis Markers No results for input(s): LATICACIDVEN, PROCALCITON, O2SATVEN in the last 168 hours.  ABG No results for input(s): PHART, PCO2ART, PO2ART in the last 168 hours.  Liver Enzymes  Recent Labs Lab 02/06/17 1330  AST 35  ALT 29  ALKPHOS 128*  BILITOT 1.0  ALBUMIN 2.7*    Cardiac Enzymes  Recent Labs Lab 02/07/17 0038 02/07/17 0618 02/09/17 0958  TROPONINI 1.30* 1.09* 7.60*    Glucose  Recent Labs Lab 02/08/17 1117 02/08/17 1704 02/08/17 2013 02/09/17 0008 02/09/17 0449 02/09/17 0725  GLUCAP 149* 93 88 142* 206* 169*    Imaging Ct Head Wo Contrast  Result Date: 02/09/2017 CLINICAL DATA:  Altered mental status. History of hypertension. Fatty tissue  removed from back of head. EXAM: CT HEAD WITHOUT CONTRAST TECHNIQUE: Contiguous axial images were obtained from the base of the skull through the vertex without intravenous contrast. COMPARISON:  MRI brain 02/08/2017 FINDINGS: Brain: Mild cerebral atrophy. Ventricular dilatation consistent with central atrophy. Patchy low-attenuation changes in the deep white matter consistent small vessel ischemia. The multiple acute punctate infarcts demonstrated on MRI are not visualized at CT. No mass effect or midline shift. No abnormal extra-axial fluid collections. Gray-white matter junctions are distinct. Basal cisterns are not effaced. No acute intracranial hemorrhage. Vascular: No hyperdense vessel or unexpected calcification. Skull: Calvarium appears intact. Sinuses/Orbits: Mild mucosal thickening in the ethmoid air cells and right maxillary antrum. No acute air-fluid levels. Mastoid air cells are not opacified. Other: None. IMPRESSION: No acute intracranial abnormalities demonstrated at CT. Chronic atrophy and small vessel ischemic changes. Electronically Signed  By: Lucienne Capers M.D.   On: 02/09/2017 01:58   Mr Jodene Nam Head Wo Contrast  Result Date: 02/08/2017 CLINICAL DATA:  Left hand weakness.  Symptoms since Sunday. EXAM: MRI HEAD WITHOUT CONTRAST MRA HEAD WITHOUT CONTRAST TECHNIQUE: Multiplanar, multiecho pulse sequences of the brain and surrounding structures were obtained without intravenous contrast. Angiographic images of the head were obtained using MRA technique without contrast. COMPARISON:  None. FINDINGS: MRI HEAD FINDINGS Brain: Numerous 1 cm or smaller acute infarcts in the bilateral peripheral cerebellum and along both cerebral convexities, nearly innumerable. Strip of restricted diffusion along the high precentral gyrus on the right, 15 mm in length. Possible tiny left posterolateral medulla infarct. No hemorrhage, hydrocephalus, or mass. Mild background chronic small vessel ischemic change in the  periventricular white matter. Vascular: Arterial findings below. Normal dural venous sinus flow voids. Skull and upper cervical spine: No marrow lesion noted. C2-3 facet arthropathy with mild anterolisthesis. Sinuses/Orbits: Bilateral cataract resection. T1 hyperintense material within the left petrous apex which does not appear expanded, likely trapped proteinaceous material. No restricted diffusion in this area. MRA HEAD FINDINGS Symmetric carotid artery size. Right ICA shelf-like appearance at the skullbase is likely artifactual based on source images. Strongly dominant right vertebral artery; the left vertebral artery ends in PICA. No major branch occlusion or reversible flow limiting stenosis. No notable atheromatous irregularity or evidence of inflammatory vasculopathy. Negative for aneurysm. IMPRESSION: 1. Numerous small acute infarcts in the bilateral cerebellum and along the bilateral cerebral convexity, a watershed/embolic pattern. 2. Background age congruent cerebral volume loss and chronic microvascular ischemia. 3. No acute finding or flow limiting stenosis on intracranial MRA. Electronically Signed   By: Monte Fantasia M.D.   On: 02/08/2017 15:59   Mr Brain Wo Contrast  Result Date: 02/08/2017 CLINICAL DATA:  Left hand weakness.  Symptoms since Sunday. EXAM: MRI HEAD WITHOUT CONTRAST MRA HEAD WITHOUT CONTRAST TECHNIQUE: Multiplanar, multiecho pulse sequences of the brain and surrounding structures were obtained without intravenous contrast. Angiographic images of the head were obtained using MRA technique without contrast. COMPARISON:  None. FINDINGS: MRI HEAD FINDINGS Brain: Numerous 1 cm or smaller acute infarcts in the bilateral peripheral cerebellum and along both cerebral convexities, nearly innumerable. Strip of restricted diffusion along the high precentral gyrus on the right, 15 mm in length. Possible tiny left posterolateral medulla infarct. No hemorrhage, hydrocephalus, or mass. Mild  background chronic small vessel ischemic change in the periventricular white matter. Vascular: Arterial findings below. Normal dural venous sinus flow voids. Skull and upper cervical spine: No marrow lesion noted. C2-3 facet arthropathy with mild anterolisthesis. Sinuses/Orbits: Bilateral cataract resection. T1 hyperintense material within the left petrous apex which does not appear expanded, likely trapped proteinaceous material. No restricted diffusion in this area. MRA HEAD FINDINGS Symmetric carotid artery size. Right ICA shelf-like appearance at the skullbase is likely artifactual based on source images. Strongly dominant right vertebral artery; the left vertebral artery ends in PICA. No major branch occlusion or reversible flow limiting stenosis. No notable atheromatous irregularity or evidence of inflammatory vasculopathy. Negative for aneurysm. IMPRESSION: 1. Numerous small acute infarcts in the bilateral cerebellum and along the bilateral cerebral convexity, a watershed/embolic pattern. 2. Background age congruent cerebral volume loss and chronic microvascular ischemia. 3. No acute finding or flow limiting stenosis on intracranial MRA. Electronically Signed   By: Monte Fantasia M.D.   On: 02/08/2017 15:59     STUDIES:  LHC 4/16 >> mild/mod 3 vessel disease, one concerning LAD culprit, no PCI ->  medical managed. MRI/MRA Head 4/17 >> numerous infarcts in bilateral cerebellum and bilateral cerebral convexity c/w watershed/embolic pattern; no acute finding or flow limiting stenosis on intracranial MRA  TTE 4/18 >> normal LV size and EF 60-65%; no RWA, G2DD, mild calcification of AV annulus with mild thickening and calcification of the leafleats Head CT 4/18 >> no changes  EEG 4/18 >>  CULTURES:   ANTIBIOTICS:   SIGNIFICANT EVENTS: 4/15 >> Admit w/Generalized weakness, dysphagia, NSTEMI 4/17 >> Code stroke called 4/18 >> PCCM consulted for airway concerns and worsening neuro  status  LINES/TUBES: Foley 4/18 >>  DISCUSSION:  29 yoM with no known prior cardiac history admited on 4/15 with generalized weakness, dysphagia, and NSTEMI.  Was taken for [facility] on 4/16 found to have 3 vessel disease and managed medically.  On 4/17, he started to have left sided focal deficits and progressive decline in neuro exam.      ASSESSMENT / PLAN:  PULMONARY A: At risk for aspiration Hx former tobacco abuse P:   ICU monitoring O2 as needed to keep sats >94% CXR as needed  CARDIOVASCULAR A:  NSTEMI-  LHC on 4/16 - originally peaked on 4/16 PAF - new onset 4/17 overnight   Hx HTN, HLD P:  Monitor hemodynamics Trend troponin x 3 given new elevation today of 7.6 Cardiology following  Heparin per Cards/pharmacy Norvasc, ASA, Lipitor, imdur,  Apresoline prn SBP >220  RENAL A:   Hypomagnesium Hx CKD3 P:   Mag 2gm given this am Trend mag Trend BMP / urinary output Replace electrolytes as indicated Avoid nephrotoxic agents, ensure adequate renal perfusion   GASTROINTESTINAL A:   Dysphagia-  P:   Failed SLP NPO Consider cortrak for TF tomorrow PPI for SUP GI following   HEMATOLOGIC A:   Anemia  Mild thrombocytopenia P:  Trend CBC Heparin as above   INFECTIOUS A:   Leukocytosis - likely inflammatory, he is afebrile P:   Trend fever/WBC curve  ENDOCRINE A: No acute issues    Hx NIDDM (HA1c 4.9 on 02/06/17) P:   CBG while npo TSH 1.798  NEUROLOGIC A:   Acute encephalopathy -  Acute CVA watershed/embolic ETOH abuse-  Facial twitching- r/o seizure P:   Neurology following Frequent neuro exams in ICU Change MVI, thiamine and folate to IV  Continue Depacon CIWA protocol- ativan    FAMILY  - Updates: Family updated by Dr. Halford Chessman at the bedside.   - Inter-disciplinary family meet or Palliative Care meeting due by:  02/16/2017    Kennieth Rad, AGACNP-BC [place] Pgr: [phone] or if no answer  [phone] 02/09/2017, 12:42 PM

## 2017-02-09 NOTE — Progress Notes (Signed)
Progress Note  Patient Name: Tenzin Edelman. Date of Encounter: 02/09/2017  Primary Cardiologist: Branch  Subjective   Now very lethargic, opens eye to verbal stimuli.   Inpatient Medications    Scheduled Meds: . amLODipine  5 mg Oral Daily  . aspirin EC  81 mg Oral Daily  . atorvastatin  80 mg Oral q1800  . dorzolamide  1 drop Both Eyes BID  . feeding supplement (GLUCERNA SHAKE)  237 mL Oral BID BM  . folic acid  1 mg Oral Daily  . insulin aspart  0-9 Units Subcutaneous Q4H  . isosorbide mononitrate  30 mg Oral QHS  . latanoprost  1 drop Both Eyes QHS  . LORazepam      . multivitamin with minerals  1 tablet Oral Daily  . sodium chloride flush  3 mL Intravenous Q12H  . thiamine  100 mg Oral Daily   Or  . thiamine  100 mg Intravenous Daily  . traZODone  50 mg Oral QHS   Continuous Infusions: . sodium chloride Stopped (02/08/17 2230)  . sodium chloride    . sodium chloride Stopped (02/08/17 1900)  . heparin    . valproate sodium     PRN Meds: sodium chloride, acetaminophen, hydrALAZINE, LORazepam **OR** LORazepam, nitroGLYCERIN, ondansetron (ZOFRAN) IV, sodium chloride flush   Vital Signs    Vitals:   02/09/17 0727 02/09/17 0732 02/09/17 0933 02/09/17 1133  BP: (!) 174/104 (!) 177/97 (!) 178/90 (!) 159/101  Pulse: (!) 105 98 94 94  Resp: 18     Temp: 97.7 F (36.5 C)     TempSrc: Axillary     SpO2: 97% 96% 96% 96%  Weight:      Height:        Intake/Output Summary (Last 24 hours) at 02/09/17 1351 Last data filed at 02/09/17 0900  Gross per 24 hour  Intake             2700 ml  Output              650 ml  Net             2050 ml   Filed Weights   02/07/17 0444 02/08/17 0500 02/09/17 0437  Weight: 166 lb 11.2 oz (75.6 kg) 170 lb 12.8 oz (77.5 kg) 172 lb 3.2 oz (78.1 kg)    Telemetry    Appear to be SVT vs AF RVR, episodes of VT noted as well. - Personally Reviewed  ECG    SR with no acute ST/T wave changes - Personally Reviewed  Physical  Exam   General: Well developed, well nourished, male lethargic Head: Normocephalic, atraumatic.  Neck: Supple without bruits, JVD. Lungs:  Resp regular and unlabored, CTA. Heart: Tachy, S1, S2, no S3, S4, or murmur; no rub. Abdomen: Soft, non-tender, non-distended with normoactive bowel sounds. No hepatomegaly. No rebound/guarding. No obvious abdominal masses. Extremities: No clubbing, cyanosis, edema. Distal pedal pulses are 2+ bilaterally. Neuro: Alert to self, opens eyes to verbal stimuli. Left arm weakness noted slightly worse today. Psych: Blunted  Labs    Chemistry Recent Labs Lab 02/06/17 1330 02/07/17 0618 02/08/17 0350 02/09/17 0445  NA  --  133* 133* 134*  K  --  4.1 4.5 3.8  CL  --  99* 103 105  CO2  --  26 23 22   GLUCOSE  --  80 107* 207*  BUN  --  36* 30* 21*  CREATININE  --  1.72* 1.58* 1.51*  CALCIUM  --  8.6* 8.3* 8.3*  PROT 5.9*  --   --   --   ALBUMIN 2.7*  --   --   --   AST 35  --   --   --   ALT 29  --   --   --   ALKPHOS 128*  --   --   --   BILITOT 1.0  --   --   --   GFRNONAA  --  37* 41* 43*  GFRAA  --  42* 47* 50*  ANIONGAP  --  8 7 7      Hematology Recent Labs Lab 02/07/17 0618 02/08/17 0350 02/09/17 0445  WBC 12.6* 12.5* 15.9*  RBC 3.32* 3.09* 3.22*  HGB 10.3* 9.8* 10.0*  HCT 30.3* 28.8* 29.3*  MCV 91.3 93.2 91.0  MCH 31.0 31.7 31.1  MCHC 34.0 34.0 34.1  RDW 12.4 12.7 12.3  PLT 188 190 162    Cardiac Enzymes Recent Labs Lab 02/07/17 0038 02/07/17 0618 02/09/17 0958  TROPONINI 1.30* 1.09* 7.60*    Recent Labs Lab 02/06/17 1226  TROPIPOC 0.89*     BNP Recent Labs Lab 02/06/17 1311  BNP 387.7*     DDimer No results for input(s): DDIMER in the last 168 hours.    Radiology    Ct Head Wo Contrast  Result Date: 02/09/2017 CLINICAL DATA:  Altered mental status. History of hypertension. Fatty tissue removed from back of head. EXAM: CT HEAD WITHOUT CONTRAST TECHNIQUE: Contiguous axial images were obtained from the  base of the skull through the vertex without intravenous contrast. COMPARISON:  MRI brain 02/08/2017 FINDINGS: Brain: Mild cerebral atrophy. Ventricular dilatation consistent with central atrophy. Patchy low-attenuation changes in the deep white matter consistent small vessel ischemia. The multiple acute punctate infarcts demonstrated on MRI are not visualized at CT. No mass effect or midline shift. No abnormal extra-axial fluid collections. Gray-white matter junctions are distinct. Basal cisterns are not effaced. No acute intracranial hemorrhage. Vascular: No hyperdense vessel or unexpected calcification. Skull: Calvarium appears intact. Sinuses/Orbits: Mild mucosal thickening in the ethmoid air cells and right maxillary antrum. No acute air-fluid levels. Mastoid air cells are not opacified. Other: None. IMPRESSION: No acute intracranial abnormalities demonstrated at CT. Chronic atrophy and small vessel ischemic changes. Electronically Signed   By: Lucienne Capers M.D.   On: 02/09/2017 01:58   Mr Jodene Nam Head Wo Contrast  Result Date: 02/08/2017 CLINICAL DATA:  Left hand weakness.  Symptoms since Sunday. EXAM: MRI HEAD WITHOUT CONTRAST MRA HEAD WITHOUT CONTRAST TECHNIQUE: Multiplanar, multiecho pulse sequences of the brain and surrounding structures were obtained without intravenous contrast. Angiographic images of the head were obtained using MRA technique without contrast. COMPARISON:  None. FINDINGS: MRI HEAD FINDINGS Brain: Numerous 1 cm or smaller acute infarcts in the bilateral peripheral cerebellum and along both cerebral convexities, nearly innumerable. Strip of restricted diffusion along the high precentral gyrus on the right, 15 mm in length. Possible tiny left posterolateral medulla infarct. No hemorrhage, hydrocephalus, or mass. Mild background chronic small vessel ischemic change in the periventricular white matter. Vascular: Arterial findings below. Normal dural venous sinus flow voids. Skull and  upper cervical spine: No marrow lesion noted. C2-3 facet arthropathy with mild anterolisthesis. Sinuses/Orbits: Bilateral cataract resection. T1 hyperintense material within the left petrous apex which does not appear expanded, likely trapped proteinaceous material. No restricted diffusion in this area. MRA HEAD FINDINGS Symmetric carotid artery size. Right ICA shelf-like appearance at the skullbase is likely artifactual based on source images.  Strongly dominant right vertebral artery; the left vertebral artery ends in PICA. No major branch occlusion or reversible flow limiting stenosis. No notable atheromatous irregularity or evidence of inflammatory vasculopathy. Negative for aneurysm. IMPRESSION: 1. Numerous small acute infarcts in the bilateral cerebellum and along the bilateral cerebral convexity, a watershed/embolic pattern. 2. Background age congruent cerebral volume loss and chronic microvascular ischemia. 3. No acute finding or flow limiting stenosis on intracranial MRA. Electronically Signed   By: Monte Fantasia M.D.   On: 02/08/2017 15:59   Mr Brain Wo Contrast  Result Date: 02/08/2017 CLINICAL DATA:  Left hand weakness.  Symptoms since Sunday. EXAM: MRI HEAD WITHOUT CONTRAST MRA HEAD WITHOUT CONTRAST TECHNIQUE: Multiplanar, multiecho pulse sequences of the brain and surrounding structures were obtained without intravenous contrast. Angiographic images of the head were obtained using MRA technique without contrast. COMPARISON:  None. FINDINGS: MRI HEAD FINDINGS Brain: Numerous 1 cm or smaller acute infarcts in the bilateral peripheral cerebellum and along both cerebral convexities, nearly innumerable. Strip of restricted diffusion along the high precentral gyrus on the right, 15 mm in length. Possible tiny left posterolateral medulla infarct. No hemorrhage, hydrocephalus, or mass. Mild background chronic small vessel ischemic change in the periventricular white matter. Vascular: Arterial findings  below. Normal dural venous sinus flow voids. Skull and upper cervical spine: No marrow lesion noted. C2-3 facet arthropathy with mild anterolisthesis. Sinuses/Orbits: Bilateral cataract resection. T1 hyperintense material within the left petrous apex which does not appear expanded, likely trapped proteinaceous material. No restricted diffusion in this area. MRA HEAD FINDINGS Symmetric carotid artery size. Right ICA shelf-like appearance at the skullbase is likely artifactual based on source images. Strongly dominant right vertebral artery; the left vertebral artery ends in PICA. No major branch occlusion or reversible flow limiting stenosis. No notable atheromatous irregularity or evidence of inflammatory vasculopathy. Negative for aneurysm. IMPRESSION: 1. Numerous small acute infarcts in the bilateral cerebellum and along the bilateral cerebral convexity, a watershed/embolic pattern. 2. Background age congruent cerebral volume loss and chronic microvascular ischemia. 3. No acute finding or flow limiting stenosis on intracranial MRA. Electronically Signed   By: Monte Fantasia M.D.   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  Prox LAD to Mid LAD lesion, 60 %stenosed - pre Diag, Mid LAD lesion, 50 %stenosed - post Diag (total length > 40 mm).  LV end diastolic pressure is mildly elevated.  There is no aortic valve stenosis.  The only really potential culprit lesion for troponin elevation in the setting of hypertension and no chest pain is the long combined lesion in the LAD. Angiographically, it is possible that the lesions in series both before and after the diagonal branch could cause anterior ischemia, however the patient is not truly had angina.  I reviewed these images with Dr. Claiborne Billings, who felt that the best course of action would be to start with medical management  using nitrates, Channel blockers and beta blockers to medically manage this moderate to severe disease. If symptoms persist, we could consider Myoview stress test to confirm or deny anterior ischemia.  This would allow for him to stabilize with hydration post catheterization.  Plan: Return to nursing unit for ongoing care  TR band removal per protocol.  The patient is significantly hypertensive which could be contributing to his elevated troponin. - Bystolic was discontinued secondary to bradycardia, may need  to reassess restarting along with amlodipine.  I have added 30 mg Imdur.  PCI of the LAD would require a long stented segment that would jailed diagonal branch. It was the decision between both myself and Dr. Claiborne Billings, that this would not be a favorable option unless there was evidence of anterior ischemia. We decided to do this instead of an FFR, in an attempt to conserve contrast.    Patient Profile     76 y.o. male with 32 yr h/o DM, CKD stage 3, recent difficulty with HTN control who was admitted with somewhat atypical chest pain and also has had difficulty with swallowing food.   Assessment & Plan    1. Positive troponin: ECG showed inferior T wave changes. With cardiac risk factors including DM, HTN, remote tobacco, family history and troponin elevation with ECG findings, recommended definitive cath.  -- cath 02/07/17 showed p/mLAD lesion that was >57mm in length. Decision was made to treat with medical therapy given the length of disease noted in the vessel. Imdur 30mg  was added. -- Developed onset of rapid rate today SVT vs Afib RVR. No reported chest pain, but lethargic. Follow up troponin 7.60 -- recommend IV heparin, continue to cycle enzymes. Would continue with medical therapy at this time given acute neurological issues -- add BB therapy   2. CKD 3: Followed by Dr. Posey Pronto. Reported baseline Cr 1.73.Cr is 1.58 post cath.   3. HTN: not well controlled. Was on lasix,  bystolic, losartan HCT. Currently on hold since admission with Cr and bradycardia.  --on amlodipine 5 mg and Imdur 30mg . -- add metoprolol    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  5. Hyperlipidemia: Statin changed to Lipitor 80mg  this admission. Will need FLP/LFTs in 6 weeks.   6. Acute CVA: MRI positive for CVA yesterday, concerning for with worsening symptoms today. Neurology currently following. Worsening left arm weakness today.   7. Tachycardia: Noted to have rapid rate this morning. SVT vs AF RVR on telemetry. Also VT, rate now stable SR in the 80s. Will have MD follow to review telemetry. Echo this admission showed normal EF. -- TSH 1.7 and Mg low at 1.4 today. Replaced per primary -- as above add metoprolol   Signed, Reino Bellis, NP  02/09/2017, 1:51 PM     Patient seen and examined. Agree with assessment and plan. Pt currently sedated from ativan.  Definite change in neurologic status since yesterday. Today he has left arm hemiparesis; yesterday had arm weakness but good hand grip.  Review of telemetry reveals sinus rhthym with PACs and NSVT 9 beats. Watershed embolic stroke pattern. Troponin increased to 7; no chest pain, will re-check ECG; Now NPO; will change metoprolol to 2.5 mg q 4 hrs and avoid hypotension. Nl EF on echo. On iv Heparin. Definitive AF has not been demonstrated. Consider carotid duplex imaging.   Troy Sine, MD, [facility] 02/09/2017 3:41 PM

## 2017-02-09 NOTE — Progress Notes (Signed)
Daily Rounding Note  02/09/2017, 9:37 AM  LOS: 3 days   SUBJECTIVE:   Chief complaint: dysphagia less alert today.  More left sided weakness   SLP at bedside but pt just got ativan for agitation and minimally cooperative  OBJECTIVE:         Vital signs in last 24 hours:    Temp:  [97.7 F (36.5 C)-98.7 F (37.1 C)] 97.7 F (36.5 C) (04/18 0727) Pulse Rate:  [72-111] 98 (04/18 0732) Resp:  [16-20] 18 (04/18 0727) BP: (148-196)/(72-108) 177/97 (04/18 0732) SpO2:  [94 %-99 %] 96 % (04/18 0732) Weight:  [77.1 kg (170 lb)-78.1 kg (172 lb 3.2 oz)] 78.1 kg (172 lb 3.2 oz) (04/18 0437) Last BM Date: 02/06/17 Filed Weights   02/07/17 0444 02/08/17 0500 02/09/17 0437  Weight: 75.6 kg (166 lb 11.2 oz) 77.5 kg (170 lb 12.8 oz) 78.1 kg (172 lb 3.2 oz)   General: looks ill.   Face: droop in left mouth   Heart: RRR Chest: slightly labored breathing Abdomen: soft, active BS, NT.    Extremities: no CCE.   Neuro/Psych:  Left sided weakness, difficult to arouse.   Intake/Output from previous day: 04/17 0701 - 04/18 0700 In: 2750 [P.O.:50; I.V.:2700] Out: 650 [Urine:650]  Intake/Output this shift: No intake/output data recorded.  Lab Results:  Recent Labs  02/07/17 0618 02/08/17 0350 02/09/17 0445  WBC 12.6* 12.5* 15.9*  HGB 10.3* 9.8* 10.0*  HCT 30.3* 28.8* 29.3*  PLT 188 190 162   BMET  Recent Labs  02/07/17 0618 02/08/17 0350 02/09/17 0445  NA 133* 133* 134*  K 4.1 4.5 3.8  CL 99* 103 105  CO2 26 23 22   GLUCOSE 80 107* 207*  BUN 36* 30* 21*  CREATININE 1.72* 1.58* 1.51*  CALCIUM 8.6* 8.3* 8.3*   LFT  Recent Labs  02/06/17 1330  PROT 5.9*  ALBUMIN 2.7*  AST 35  ALT 29  ALKPHOS 128*  BILITOT 1.0  BILIDIR 0.2  IBILI 0.8   PT/INR  Recent Labs  02/07/17 0618  LABPROT 14.4  INR 1.11   Hepatitis Panel No results for input(s): HEPBSAG, HCVAB, HEPAIGM, HEPBIGM in the last 72  hours.  Studies/Results: Ct Head Wo Contrast  Result Date: 02/09/2017 CLINICAL DATA:  Altered mental status. History of hypertension. Fatty tissue removed from back of head. EXAM: CT HEAD WITHOUT CONTRAST TECHNIQUE: Contiguous axial images were obtained from the base of the skull through the vertex without intravenous contrast. COMPARISON:  MRI brain 02/08/2017 FINDINGS: Brain: Mild cerebral atrophy. Ventricular dilatation consistent with central atrophy. Patchy low-attenuation changes in the deep white matter consistent small vessel ischemia. The multiple acute punctate infarcts demonstrated on MRI are not visualized at CT. No mass effect or midline shift. No abnormal extra-axial fluid collections. Gray-white matter junctions are distinct. Basal cisterns are not effaced. No acute intracranial hemorrhage. Vascular: No hyperdense vessel or unexpected calcification. Skull: Calvarium appears intact. Sinuses/Orbits: Mild mucosal thickening in the ethmoid air cells and right maxillary antrum. No acute air-fluid levels. Mastoid air cells are not opacified. Other: None. IMPRESSION: No acute intracranial abnormalities demonstrated at CT. Chronic atrophy and small vessel ischemic changes. Electronically Signed   By: Lucienne Capers M.D.   On: 02/09/2017 01:58   Mr Jodene Nam Head Wo Contrast  Result Date: 02/08/2017 CLINICAL DATA:  Left hand weakness.  Symptoms since Sunday. EXAM: MRI HEAD WITHOUT CONTRAST MRA HEAD WITHOUT CONTRAST TECHNIQUE: Multiplanar, multiecho pulse sequences of the  brain and surrounding structures were obtained without intravenous contrast. Angiographic images of the head were obtained using MRA technique without contrast. COMPARISON:  None. FINDINGS: MRI HEAD FINDINGS Brain: Numerous 1 cm or smaller acute infarcts in the bilateral peripheral cerebellum and along both cerebral convexities, nearly innumerable. Strip of restricted diffusion along the high precentral gyrus on the right, 15 mm in  length. Possible tiny left posterolateral medulla infarct. No hemorrhage, hydrocephalus, or mass. Mild background chronic small vessel ischemic change in the periventricular white matter. Vascular: Arterial findings below. Normal dural venous sinus flow voids. Skull and upper cervical spine: No marrow lesion noted. C2-3 facet arthropathy with mild anterolisthesis. Sinuses/Orbits: Bilateral cataract resection. T1 hyperintense material within the left petrous apex which does not appear expanded, likely trapped proteinaceous material. No restricted diffusion in this area. MRA HEAD FINDINGS Symmetric carotid artery size. Right ICA shelf-like appearance at the skullbase is likely artifactual based on source images. Strongly dominant right vertebral artery; the left vertebral artery ends in PICA. No major branch occlusion or reversible flow limiting stenosis. No notable atheromatous irregularity or evidence of inflammatory vasculopathy. Negative for aneurysm. IMPRESSION: 1. Numerous small acute infarcts in the bilateral cerebellum and along the bilateral cerebral convexity, a watershed/embolic pattern. 2. Background age congruent cerebral volume loss and chronic microvascular ischemia. 3. No acute finding or flow limiting stenosis on intracranial MRA. Electronically Signed   By: Monte Fantasia M.D.   On: 02/08/2017 15:59   Mr Brain Wo Contrast  Result Date: 02/08/2017 CLINICAL DATA:  Left hand weakness.  Symptoms since Sunday. EXAM: MRI HEAD WITHOUT CONTRAST MRA HEAD WITHOUT CONTRAST TECHNIQUE: Multiplanar, multiecho pulse sequences of the brain and surrounding structures were obtained without intravenous contrast. Angiographic images of the head were obtained using MRA technique without contrast. COMPARISON:  None. FINDINGS: MRI HEAD FINDINGS Brain: Numerous 1 cm or smaller acute infarcts in the bilateral peripheral cerebellum and along both cerebral convexities, nearly innumerable. Strip of restricted diffusion  along the high precentral gyrus on the right, 15 mm in length. Possible tiny left posterolateral medulla infarct. No hemorrhage, hydrocephalus, or mass. Mild background chronic small vessel ischemic change in the periventricular white matter. Vascular: Arterial findings below. Normal dural venous sinus flow voids. Skull and upper cervical spine: No marrow lesion noted. C2-3 facet arthropathy with mild anterolisthesis. Sinuses/Orbits: Bilateral cataract resection. T1 hyperintense material within the left petrous apex which does not appear expanded, likely trapped proteinaceous material. No restricted diffusion in this area. MRA HEAD FINDINGS Symmetric carotid artery size. Right ICA shelf-like appearance at the skullbase is likely artifactual based on source images. Strongly dominant right vertebral artery; the left vertebral artery ends in PICA. No major branch occlusion or reversible flow limiting stenosis. No notable atheromatous irregularity or evidence of inflammatory vasculopathy. Negative for aneurysm. IMPRESSION: 1. Numerous small acute infarcts in the bilateral cerebellum and along the bilateral cerebral convexity, a watershed/embolic pattern. 2. Background age congruent cerebral volume loss and chronic microvascular ischemia. 3. No acute finding or flow limiting stenosis on intracranial MRA. Electronically Signed   By: Monte Fantasia M.D.   On: 02/08/2017 15:59    ASSESMENT:   *  Dysphagia, weight loss  *  CVA, embolic  *  ETOH abuse.    PLAN   *  DG esophagus cancelled.  SLP attempting BS swallow eval.  He will need placement of NGT for meds and feeding in the near term.       Azucena Freed  02/09/2017, 9:37 AM Pager:  [phone]  I have discussed the case with the PA, and that is the plan I formulated. I personally interviewed and examined the patient.  The patient's mental status has deteriorated, apparently from both the shower emboli CVAs and perhaps alcohol withdrawal. He cannot  participate in a barium study. Even if he had an upper endoscopy right now to evaluate and treat the source of his dysphagia, he is not awake enough to protect his airway and safely eat.  Depending upon his family's goals of care, he will need enteral access such as a Dobbhoff tube if he is cooperative enough to undergo that and not pull it out.  I am not planning to pursue further workup for his dysphagia until we see what his neurological status will be in the coming days. Therefore we will sign off and I will be happy to reconsult when it appears that his clinical condition will allow this.    Nelida Meuse III Pager [phone]  Mon-Fri 8a-5p [phone] after 5p, weekends, holidays

## 2017-02-09 NOTE — Progress Notes (Signed)
Family Medicine Teaching Service Daily Progress Note Intern Pager: [phone]  Patient name: Charles Hanson. Medical record number: [id number] Date of birth: 28-Aug-1941 Age: 76 y.o. Gender: male  Primary Care Provider: [facility] Consultants: Cardiology  Code Status: Full  Pt Overview and Major Events to Date:  04/15: Admit for NSTEMI, heparin gtt initiated, minor AKI 04/16: LHC mild-mod dz, proceed with medical management, consider Myoview if symptoms persist 04/17: GI consulted for dysphagia, new onset left hand grip weakness, code stroke called 04/18: Worsening weakness, cannot see, left lateral deviation, troponin elevation, transferred to ICU  Assessment and Plan: Charles Hanson. is a 75 y.o. male presenting with generalized weakness and chest pain. PMH is significant for HTN, HLD, CKDIII, NIDDM.  #Watershed embolic stroke  Left-hand weakness  Possible seizure: Acute. Dx by MRI brain. 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 EEG unremarkable. --Neuro consulted, appreciate recommendations: ASA and statin therapy, avoid fever and hyperglycemia --Heparin gtt given acute neurological changes with confirmed ischemic, not hemorrhagic stroke --PT/OT: pending --ICU consulted: will admit to their service for further management  --Keppra loading dose  #Atypical chest pain  Inferior-NSTEMI  SVT: Acute. SVT 170s with 7 beat EKG inferior NSTEMI, s/p heparin gtt. Echo 60-65%, G2DD. [place] 4/16 showed mild-mod dz with rec for med management.  --Cardiology consulted, appreciate recommendations: Med management BB, CCB, nitrates, Bystolic d/c 2/2 bradycardia, consider restarting amlodipine, started Imdur 30 mg QD, Myoview if symptoms persist --Obtaining EKG and repeat troponin --ASA 81 mg QD, nitro PRN, Zofran PRN, O2 therapy to keep sat equal >92% --Lipitor 80 mg QD --Monitor BP and HR --Cardiac monitoring, continuous pulse  ox  #Dysphasia: Subacute. Daughter noted 20 pound weight loss over 4 months. Patient is endorsing difficulty swallowing foods and liquids for the past several months. Patient failed SLP evaluation with fluids and liquids. --GI resulted, appreciate recommendations --MRI/MRA of head pending  #Hypertension  Hyperlipidemia: Chronic. Stable. BP at 170/90s. On Lasix, Bystolic, and losartan-HCTZ at home. On statin therapy. --Holding antihypertensives given permissive HTN --Lipitor 80 mg QD, will likely need high-intensity statin on discharge --ASA 81 mg QD  #Acute kidney injury  Chronic kidney disease stage III: Chronic. Resolved. Baseline 1.73 per chart review. Creatinine clearance 32 mL/min. --Holding antihypertensives above given AKI --IVF NS@100mL /hr --Avoid nephrotoxic agents --Daily BMET  #Non-insulin-dependent diabetes mellitus: Chronic. Stable. On glimepiride at home. She only completed dose of prednisone for plantar fasciitis. CBGs remain controlled at 3s. A1c 4.9 during admission. --Holding glimepiride 2 mg QD --Diabetes coordinator suggesting discontinuing glimepiride on discharge given low CBGs  FEN/GI: NPO, IVF NS@100mL /hr Prophylaxis: Heparin SQ  Disposition: Transfer to ICU for worsening ischemic stroke and suspected repeat NSTEMI  Subjective:  Patient states he cannot see. Able to move RUE, RLE and LLE but not LUE.   Objective: Temp:  [97.9 F (36.6 C)-98.7 F (37.1 C)] 97.9 F (36.6 C) (04/18 0437) Pulse Rate:  [72-111] 105 (04/18 0500) Resp:  [16-20] 20 (04/18 0230) BP: (148-196)/(72-108) 178/108 (04/18 0500) SpO2:  [94 %-99 %] 94 % (04/18 0437) Weight:  [170 lb (77.1 kg)-172 lb 3.2 oz (78.1 kg)] 172 lb 3.2 oz (78.1 kg) (04/18 0437) Physical Exam: General: elderly male, well nourished, well developed, in no acute distress with non-toxic appearance HEENT: normocephalic, atraumatic, moist mucous membranes, PERRLA, unable to perform OM exam, L lateral  deviation Neck: supple, non-tender without lymphadenopathy, no JVD CV: bradycardic with regular rhythm without murmurs, rubs, or gallops  Lungs: clear to auscultation bilaterally with normal work of breathing on room air Abdomen: soft, non-tender, normoactive bowel sounds Skin: warm, dry, no rashes or lesions, cap refill < 2 seconds Extremities: warm and well perfused, normal tone Neuro: limited exam due to depressed mentation, 4/5 motor strength LE b/l, 3/5 grip strengh R hand with 0/5 L hand Psych: A&O to person only  Laboratory:  Recent Labs Lab 02/07/17 0618 02/08/17 0350 02/09/17 0445  WBC 12.6* 12.5* 15.9*  HGB 10.3* 9.8* 10.0*  HCT 30.3* 28.8* 29.3*  PLT 188 190 162    Recent Labs Lab 02/06/17 1330 02/07/17 0618 02/08/17 0350 02/09/17 0445  NA  --  133* 133* 134*  K  --  4.1 4.5 3.8  CL  --  99* 103 105  CO2  --  26 23 22   BUN  --  36* 30* 21*  CREATININE  --  1.72* 1.58* 1.51*  CALCIUM  --  8.6* 8.3* 8.3*  PROT 5.9*  --   --   --   BILITOT 1.0  --   --   --   ALKPHOS 128*  --   --   --   ALT 29  --   --   --   AST 35  --   --   --   GLUCOSE  --  80 107* 207*   Troponin: 0.89 > 1.30 > 1.09 UA: Protein 300 TSH: 1.798 (WNL) BNP: 387.7 (H) Hepatic panel: Alkaline phosphatase 128 (H), albumin 2.7 (L), total protein 5.9 (L) Lipase: 41 (WNL) Lipid panel: cholesterol 190 (WNL), TGY 88 (WNL), HDL 60 (WNL), LDL 112 (H)  Imaging/Diagnostic Tests: CT HEAD WO CONTRAST (02/09/2017) FINDINGS: Brain: Mild cerebral atrophy. Ventricular dilatation consistent with central atrophy. Patchy low-attenuation changes in the deep white matter consistent small vessel ischemia. The multiple acute punctate infarcts demonstrated on MRI are not visualized at CT. No mass effect or midline shift. No abnormal extra-axial fluid collections. Gray-white matter junctions are distinct. Basal cisterns are not effaced. No acute intracranial hemorrhage. Vascular: No hyperdense vessel or  unexpected calcification. Skull: Calvarium appears intact. Sinuses/Orbits: Mild mucosal thickening in the ethmoid air cells and right maxillary antrum. No acute air-fluid levels. Mastoid air cells are not opacified. Other: None.  IMPRESSION: No acute intracranial abnormalities demonstrated at CT. Chronic atrophy and small vessel ischemic changes.  MRI/MRA HEAD WO CONTRAST (02/08/2017) FINDINGS: MRI HEAD FINDINGS Brain: Numerous 1 cm or smaller acute infarcts in the bilateral peripheral cerebellum and along both cerebral convexities, nearly innumerable. Strip of restricted diffusion along the high precentral gyrus on the right, 15 mm in length. Possible tiny left posterolateral medulla infarct. No hemorrhage, hydrocephalus, or mass. Mild background chronic small vessel ischemic change in the periventricular white matter. Vascular: Arterial findings below. Normal dural venous sinus flow voids. Skull and upper cervical spine: No marrow lesion noted. C2-3 facet arthropathy with mild anterolisthesis. Sinuses/Orbits: Bilateral cataract resection. T1 hyperintense material within the left petrous apex which does not appear expanded, likely trapped proteinaceous material. No restricted diffusion in this area.  MRA HEAD FINDINGS Symmetric carotid artery size. Right ICA shelf-like appearance at the skullbase is likely artifactual based on source images. Strongly dominant right vertebral artery; the left vertebral artery ends in PICA. No major branch occlusion or reversible flow limiting stenosis. No notable atheromatous irregularity or evidence of inflammatory vasculopathy. Negative for aneurysm.  IMPRESSION: 1. Numerous small acute infarcts in the bilateral cerebellum and along the bilateral cerebral convexity, a watershed/embolic pattern. 2.  Background age congruent cerebral volume loss and chronic microvascular ischemia. 3. No acute finding or flow limiting stenosis on intracranial MRA.  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 Prox LAD to Mid LAD lesion, 60 %stenosed - pre Diag, Mid LAD lesion, 50 %stenosed - post Diag (total length > 40 mm). LV end diastolic pressure is mildly elevated. There is no aortic valve stenosis. Best course of action would be to start with medical management using nitrates, Channel blockers and beta blockers to medically manage this moderate to severe disease. If symptoms persist, we could consider Myoview stress test to confirm or deny anterior ischemia  PLAN: TR band removal per protocol. The patient is significantly hypertensive which could be contributing to his elevated troponin. Bystolic was discontinued secondary to bradycardia, may need to reassess restarting along with amlodipine. I have added 30 mg Imdur. PCI of the LAD would require a long stented segment that would jailed diagonal branch. It was the decision between both myself and Dr. Claiborne Billings, that this would not be a favorable option unless there was evidence of anterior ischemia. We decided to do this instead of an FFR, in an attempt to conserve contrast  Transthoracic Echocardiography (02/07/2017) CONCLUSION: Left ventricle: The cavity size was normal. Wall thickness was normal. Systolic function was normal. The estimated ejection fraction was in the range of 60% to 65%. Wall motion was normal; there were no regional wall motion abnormalities. Features are consistent with a pseudonormal left ventricular filling pattern, with concomitant abnormal relaxation and increased filling pressure (grade 2 diastolic dysfunction). Aortic valve: Mildly calcified annulus. Mildly thickened, mildly calcified leaflets. There was trivial regurgitation. Pulmonary arteries: Systolic pressure was mildly increased. PA peak pressure: 34 mm Hg (S).  DG Chest 2 View (02/06/2017) FINDINGS: Normal mediastinum and  cardiac silhouette. Normal pulmonary vasculature. No evidence of effusion, infiltrate, or pneumothorax. No acute bony abnormality. Degenerative osteophytosis of the spine.  IMPRESSION: No acute cardiopulmonary process.  [place] Bing, DO 02/09/2017, 6:26 AM PGY-1, [place] Intern pager: [phone], text pages welcome

## 2017-02-09 NOTE — Progress Notes (Signed)
Notified Dr. Dallas Schimke and Dr. Sheila Oats of burst of vtach and SVT/Vtach. Dr. Leonie Man at bedside. Pt goes between lethargy and agitation. Pt unable to move left arm and moves left leg slightly. Pt developing more of left sided droop. Continue to monitor. Carroll Kinds RN

## 2017-02-09 NOTE — Progress Notes (Signed)
RN reported off to day shift RN at bedside. Made aware of progression of patient's neurological status through the evening. Patient having difficulty starting stream, unable to stand. One time straight cath ordered, >200 bladder scan, received 250cc with straight cath.  RN made aware of 8 beat V RUN and short SVT that occurred during shift change.

## 2017-02-09 NOTE — Progress Notes (Signed)
ANTICOAGULATION CONSULT NOTE - Initial Consult  Pharmacy Consult for Heparin Indication: atrial fibrillation and stroke  No Known Allergies  Patient Measurements: Height: 5\' 8"  (172.7 cm) Weight: 172 lb 3.2 oz (78.1 kg) IBW/kg (Calculated) : 68.4  Vital Signs: Temp: 97.7 F (36.5 C) (04/18 0727) Temp Source: Axillary (04/18 0727) BP: 177/97 (04/18 0732) Pulse Rate: 98 (04/18 0732)  Labs:  Recent Labs  02/06/17 2048 02/07/17 0038 02/07/17 0618 02/07/17 1435 02/08/17 0350 02/09/17 0445  HGB  --   --  10.3*  --  9.8* 10.0*  HCT  --   --  30.3*  --  28.8* 29.3*  PLT  --   --  188  --  190 162  LABPROT  --   --  14.4  --   --   --   INR  --   --  1.11  --   --   --   HEPARINUNFRC 0.33  --  0.28* 0.32  --   --   CREATININE  --   --  1.72*  --  1.58* 1.51*  TROPONINI  --  1.30* 1.09*  --   --   --     Estimated Creatinine Clearance: 39.6 mL/min (A) (by C-G formula based on SCr of 1.51 mg/dL (H)).   Medical History: Past Medical History:  Diagnosis Date  . Chronic kidney disease   . Hypertension     Medications:  Scheduled:  . amLODipine  5 mg Oral Daily  . aspirin EC  81 mg Oral Daily  . atorvastatin  80 mg Oral q1800  . dorzolamide  1 drop Both Eyes BID  . feeding supplement (GLUCERNA SHAKE)  237 mL Oral BID BM  . folic acid  1 mg Oral Daily  . heparin subcutaneous  5,000 Units Subcutaneous Q8H  . insulin aspart  0-9 Units Subcutaneous Q4H  . isosorbide mononitrate  30 mg Oral QHS  . latanoprost  1 drop Both Eyes QHS  . LORazepam      . multivitamin with minerals  1 tablet Oral Daily  . sodium chloride flush  3 mL Intravenous Q12H  . thiamine  100 mg Oral Daily   Or  . thiamine  100 mg Intravenous Daily  . traZODone  50 mg Oral QHS    Assessment: 76yo male with AFib and worsening neuro status with seizures & stroke, to start heparin.  I discussed with Stroke Team and will proceed, no boluses.  Pt has been on Heparin SQ and last dose was at 0620 today.   Pt previously within goal range on Heparin 1150 units/hr.  Goal of Therapy:  Heparin level 0.3-0.5 units/ml Monitor platelets by anticoagulation protocol: Yes   Plan:  Heparin 1150 units/hr Heparin level 8hr Daily heparin level, CBC Watch for s/s of bleeding.   Gracy Bruins, PharmD Clinical Pharmacist [facility]

## 2017-02-10 ENCOUNTER — Inpatient Hospital Stay (HOSPITAL_COMMUNITY): Payer: Medicare Other

## 2017-02-10 DIAGNOSIS — R531 Weakness: Secondary | ICD-10-CM

## 2017-02-10 DIAGNOSIS — J9601 Acute respiratory failure with hypoxia: Secondary | ICD-10-CM

## 2017-02-10 DIAGNOSIS — R4182 Altered mental status, unspecified: Secondary | ICD-10-CM

## 2017-02-10 DIAGNOSIS — I638 Other cerebral infarction: Secondary | ICD-10-CM

## 2017-02-10 DIAGNOSIS — I639 Cerebral infarction, unspecified: Secondary | ICD-10-CM

## 2017-02-10 DIAGNOSIS — J810 Acute pulmonary edema: Secondary | ICD-10-CM

## 2017-02-10 DIAGNOSIS — I6340 Cerebral infarction due to embolism of unspecified cerebral artery: Secondary | ICD-10-CM

## 2017-02-10 DIAGNOSIS — I63411 Cerebral infarction due to embolism of right middle cerebral artery: Secondary | ICD-10-CM

## 2017-02-10 LAB — PROCALCITONIN: PROCALCITONIN: 0.68 ng/mL

## 2017-02-10 LAB — MAGNESIUM: Magnesium: 2.3 mg/dL (ref 1.7–2.4)

## 2017-02-10 LAB — GLUCOSE, CAPILLARY
GLUCOSE-CAPILLARY: 107 mg/dL — AB (ref 65–99)
GLUCOSE-CAPILLARY: 118 mg/dL — AB (ref 65–99)
Glucose-Capillary: 117 mg/dL — ABNORMAL HIGH (ref 65–99)
Glucose-Capillary: 151 mg/dL — ABNORMAL HIGH (ref 65–99)
Glucose-Capillary: 168 mg/dL — ABNORMAL HIGH (ref 65–99)

## 2017-02-10 LAB — VAS US CAROTID
LCCADSYS: 46 cm/s
LEFT ECA DIAS: -5 cm/s
LEFT VERTEBRAL DIAS: 7 cm/s
LICADSYS: -53 cm/s
Left CCA dist dias: 5 cm/s
Left CCA prox dias: 7 cm/s
Left CCA prox sys: 37 cm/s
Left ICA dist dias: -14 cm/s
Left ICA prox dias: -16 cm/s
Left ICA prox sys: -65 cm/s
RCCAPSYS: -43 cm/s
RIGHT ECA DIAS: -5 cm/s
RIGHT VERTEBRAL DIAS: -13 cm/s
Right CCA prox dias: -7 cm/s
Right cca dist sys: -91 cm/s

## 2017-02-10 LAB — CBC
HEMATOCRIT: 32.5 % — AB (ref 39.0–52.0)
HEMOGLOBIN: 10.8 g/dL — AB (ref 13.0–17.0)
MCH: 30.6 pg (ref 26.0–34.0)
MCHC: 33.2 g/dL (ref 30.0–36.0)
MCV: 92.1 fL (ref 78.0–100.0)
Platelets: 183 10*3/uL (ref 150–400)
RBC: 3.53 MIL/uL — ABNORMAL LOW (ref 4.22–5.81)
RDW: 12.5 % (ref 11.5–15.5)
WBC: 21 10*3/uL — ABNORMAL HIGH (ref 4.0–10.5)

## 2017-02-10 LAB — BASIC METABOLIC PANEL
Anion gap: 8 (ref 5–15)
BUN: 21 mg/dL — AB (ref 6–20)
CHLORIDE: 107 mmol/L (ref 101–111)
CO2: 22 mmol/L (ref 22–32)
CREATININE: 1.63 mg/dL — AB (ref 0.61–1.24)
Calcium: 8.3 mg/dL — ABNORMAL LOW (ref 8.9–10.3)
GFR calc non Af Amer: 39 mL/min — ABNORMAL LOW (ref >60)
GFR, EST AFRICAN AMERICAN: 45 mL/min — AB (ref >60)
Glucose, Bld: 129 mg/dL — ABNORMAL HIGH (ref 65–99)
Potassium: 4.3 mmol/L (ref 3.5–5.1)
Sodium: 137 mmol/L (ref 135–145)

## 2017-02-10 LAB — TROPONIN I
TROPONIN I: 7.78 ng/mL — AB (ref <0.03)
Troponin I: 9.84 ng/mL (ref <0.03)

## 2017-02-10 MED ORDER — MORPHINE SULFATE (PF) 2 MG/ML IV SOLN
1.0000 mg | INTRAVENOUS | Status: DC | PRN
  Administered 2017-02-13: 2 mg via INTRAVENOUS
  Filled 2017-02-10: qty 1

## 2017-02-10 MED ORDER — FUROSEMIDE 10 MG/ML IJ SOLN
40.0000 mg | Freq: Once | INTRAMUSCULAR | Status: AC
  Administered 2017-02-10: 40 mg via INTRAVENOUS
  Filled 2017-02-10: qty 4

## 2017-02-10 MED ORDER — HEPARIN (PORCINE) IN NACL 100-0.45 UNIT/ML-% IJ SOLN
1150.0000 [IU]/h | INTRAMUSCULAR | Status: DC
  Filled 2017-02-10: qty 250

## 2017-02-10 MED ORDER — VITAL HIGH PROTEIN PO LIQD
1000.0000 mL | ORAL | Status: DC
  Administered 2017-02-10: 1000 mL

## 2017-02-10 MED ORDER — SODIUM CHLORIDE 0.9 % IV SOLN
1.5000 g | Freq: Four times a day (QID) | INTRAVENOUS | Status: DC
  Administered 2017-02-10 – 2017-02-12 (×8): 1.5 g via INTRAVENOUS
  Filled 2017-02-10 (×9): qty 1.5

## 2017-02-10 MED ORDER — DEXTROSE 5 % IV SOLN
500.0000 mg | Freq: Three times a day (TID) | INTRAVENOUS | Status: DC
  Administered 2017-02-10 – 2017-02-13 (×8): 500 mg via INTRAVENOUS
  Filled 2017-02-10 (×11): qty 5

## 2017-02-10 NOTE — Progress Notes (Signed)
Pharmacy Antibiotic Note  Donaciano Range. is a 76 y.o. male admitted on 02/06/2017 with pneumonia.  Pharmacy has been consulted for Unasyn dosing. Scr 1.6 CrCl ~35, WBC up 21.0, afebrile  Plan: Unasyn 1.5g IV Q6 hours Monitor clinical progression, renal function and LOT  Height: 5\' 8"  (172.7 cm) Weight: 172 lb 13.5 oz (78.4 kg) IBW/kg (Calculated) : 68.4  Temp (24hrs), Avg:98 F (36.7 C), Min:97.3 F (36.3 C), Max:98.3 F (36.8 C)   Recent Labs Lab 02/06/17 1207 02/07/17 0618 02/08/17 0350 02/09/17 0445 02/10/17 0131  WBC 14.9* 12.6* 12.5* 15.9* 21.0*  CREATININE 1.88* 1.72* 1.58* 1.51* 1.63*    Estimated Creatinine Clearance: 36.7 mL/min (A) (by C-G formula based on SCr of 1.63 mg/dL (H)).    No Known Allergies  Thank you for allowing pharmacy to be a part of this patient's care.  Jodean Lima Brandin Dilday 02/10/2017 11:14 AM

## 2017-02-10 NOTE — Care Management Note (Signed)
Case Management Note Initial note by: Bethena Roys, RN 02/09/2017, 9:56 AM  Patient Details  Name: Charles Hanson. MRN: [id number] Date of Birth: June 17, 1941  Subjective/Objective:  Pt presented for Chest Pain-positive troponin. 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 Pt with a Hx of ETOH Abuse on CIWA protocol. PTA-Noncompliant with medication regimen. Pt with worsening neurological status. Plan will be to transfer to ICU.                    Action/Plan: CM will continue to monitor. Initial plans were for home with [facility]. Pt will need to have PT to reevaluate once stable for future recommendations.   Expected Discharge Date:                  Expected Discharge Plan:  [place]  In-House Referral:     Discharge planning Services  CM Consult  Post Acute Care Choice:    Choice offered to:     DME Arranged:    DME Agency:     HH Arranged:    [place] Agency:     Status of Service:  In process, will continue to follow  If discussed at Long Length of Stay Meetings, dates discussed:    Additional Comments: 02/10/17 J. Carliss Quast, RN, BSN Pt with continuing neurological decline; MRI showing bi-cerebral embolic infarcts.  Family has made pt a DNR; remains NPO due to severe aspiration risk.  Will continue to follow progress.    Reinaldo Raddle, RN, BSN  Trauma/Neuro ICU Case Manager [phone]

## 2017-02-10 NOTE — Progress Notes (Signed)
Nutrition Follow-up  DOCUMENTATION CODES:   Non-severe (moderate) malnutrition in context of chronic illness  INTERVENTION:  Once ready to advance tube feedings recommend: Jevity 1.2 @ 20 ml/hr and increase by 10 ml every 8 hours to goal rate of 60 ml/hr 30 ml Prostat daily Provides: 1828 kcal, 95 grams protein, and 1166 ml free water.   Recommend monitor magnesium, potassium, and phosphorus daily for at least 3 days, MD to replete as needed, as pt is at risk for refeeding syndrome given malnutrition.  NUTRITION DIAGNOSIS:   Malnutrition related to chronic illness (DM, CKD, HTN) as evidenced by mild depletion of muscle mass, mild depletion of body fat, percent weight loss (12% Weight loss within 6 months). Ongoing.   GOAL:   Patient will meet greater than or equal to 90% of their needs Progressing.   MONITOR:   PO intake, Supplement acceptance, Labs, I & O's  REASON FOR ASSESSMENT:   Consult Enteral/tube feeding initiation and management  ASSESSMENT:   76 y.o. male who has a 37 yr h/o DM, CKD stage 3, recent difficulty with HTN control who was admitted with somewhat atypical chest pain and also has had difficulty with swallowing food.   Transferred to ICU Plan for Cortrak tube and start feedings today Vital High Protein @ 20 ml/hr, trickle feedings provide: 480 kcal and 36  Spoke with family at bedside and Therapist, sports.   Diet Order:  Diet NPO time specified  Skin:  Reviewed, no issues  Last BM:  4/15  Height:   Ht Readings from Last 1 Encounters:  02/09/17 5\' 8"  (1.727 m)    Weight:   Wt Readings from Last 1 Encounters:  02/09/17 172 lb 13.5 oz (78.4 kg)    Ideal Body Weight:  70 kg  BMI:  Body mass index is 26.28 kg/m.  Estimated Nutritional Needs:   Kcal:  1245-8099  Protein:  85-95 gm  Fluid:  1.8 L  EDUCATION NEEDS:   No education needs identified at this time  [place], [place], [place] Pager [phone] After Hours Pager

## 2017-02-10 NOTE — Progress Notes (Signed)
**  Preliminary report by tech**  Carotid artery duplex complete. Findings are consistent with a 1-39 percent stenosis involving the right internal carotid artery and the left internal carotid artery. The vertebral arteries demonstrate antegrade flow.  02/10/17 12:54 PM Charles Hanson RVT

## 2017-02-10 NOTE — Progress Notes (Addendum)
PULMONARY / CRITICAL CARE MEDICINE   Name: Charles Hanson. MRN: [id number] DOB: 12-23-1940    ADMISSION DATE:  02/06/2017 CONSULTATION DATE:  02/09/2017  REFERRING MD:  Dr. Andria Frames  CHIEF COMPLAINT:  Altered mental status  HISTORY OF PRESENT ILLNESS:  76 year old Male admitted on 4/15 with generalized weakness and chest pain.  Information obtained from medical chart review.  Patient has a medical history of former smoker (quit 6 years ago), HTN, CKD stage 3, current ETOH abuse, HLD, and NIDDM.   He presented with a 10 day history of progressive generalized weakness, fatigue, and atypical chest pain.  Chest pain occurred with swallowing food, described as a burning sensation, and that food would get stuck resulting in him vomiting the food back up.  This has been progressive over the last 3 months with family estimating a 20 lb weight loss  His fatigue was accompanied with a decrease in activity due to the pain in his feet in which he received a steroid injection by a podiatrist 3 days prior to admit. Additionally, he had just started taking bystolic for HTN although his family admits he only takes the medicine his doctor gives him for free.    On admit, he was found to be bradycardic and hypertensive with positive troponin's and EKG changes c/w NSTEMI with inferior lead involvement.  He was placed on heparin gtt.  Cardiology consulted and he was taken for a LHC on 4/16.  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  GI also consulted for evaluation of his dysphasia.    On 4/17, the patient was found to have new onset left hand weakness.  A code stroke was called and MRI obtained. NIHSS was 0.  It was some debate over if this left hand weakness was new or ongoing since 4/15  according to the family. TPA was deferred given the uncertainty of LWN time.  MRI revealed innumerable  multifocal areas of ischemic infarction consistent with watershed embolic stroke.    Overnight on 4/17, his neurological status continued to deteriorate which was somewhat obscured by the ativan he was receiving per the CIWA protocol.  He was lethargic but following commands but found to have a left gaze, right neglect.  He additionally developed intermittent right facial twitching.  EEG did not indicate seizure activity but exam limited due to excessive motion artifacts; Depacon was started. 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    PCCM called for concern for airway protection and monitoring.   SUBJECTIVE:  Per RN, progressive decline in neuro status and increasing NIHS, now 30.  Last ativan last night at [place].  VITAL SIGNS: BP 128/77   Pulse 80   Temp 98.1 F (36.7 C) (Axillary)   Resp (!) 26   Ht 5\' 8"  (1.727 m)   Wt 172 lb 13.5 oz (78.4 kg)   SpO2 99%   BMI 26.28 kg/m   HEMODYNAMICS:    VENTILATOR SETTINGS:    INTAKE / OUTPUT: I/O last 3 completed shifts: In: 3881.7 [I.V.:3826.7; IV Piggyback:55] Out: 703 [Urine:985]  PHYSICAL EXAMINATION: General:  Elderly male lying in bed in mild distress HEENT: MM pink/moist Neuro: Obtunded, GCS  7 (no eye opening, moans to pain, withdrawals to pain ), +gag, pupils 3/= reactive, occasionally with move right side CV: RR, ST ~100, no m/r/g,  hypertensive PULM: tachypneic, even/ labored breathing with abd muscle use, diffuse rhonchi GI: round, soft, bs hypoactive Extremities: cool, no edema, BLE mottled with decreased pulses Skin: no rashes or lesions, dressing to left elbow  LABS:  BMET  Recent Labs Lab 02/08/17 0350 02/09/17 0445 02/10/17 0131  NA 133* 134* 137  K 4.5 3.8 4.3  CL 103 105 107  CO2 23 22 22   BUN 30* 21* 21*  CREATININE 1.58* 1.51* 1.63*  GLUCOSE 107* 207* 129*    Electrolytes  Recent Labs Lab 02/08/17 0350 02/09/17 0445 02/10/17 0131   CALCIUM 8.3* 8.3* 8.3*  MG  --  1.4* 2.3    CBC  Recent Labs Lab 02/08/17 0350 02/09/17 0445 02/10/17 0131  WBC 12.5* 15.9* 21.0*  HGB 9.8* 10.0* 10.8*  HCT 28.8* 29.3* 32.5*  PLT 190 162 183    Coag's  Recent Labs Lab 02/07/17 0618  INR 1.11    Sepsis Markers No results for input(s): LATICACIDVEN, PROCALCITON, O2SATVEN in the last 168 hours.  ABG No results for input(s): PHART, PCO2ART, PO2ART in the last 168 hours.  Liver Enzymes  Recent Labs Lab 02/06/17 1330  AST 35  ALT 29  ALKPHOS 128*  BILITOT 1.0  ALBUMIN 2.7*    Cardiac Enzymes  Recent Labs Lab 02/09/17 1524 02/09/17 1916 02/10/17 0131  TROPONINI 7.96* 9.32* 9.84*    Glucose  Recent Labs Lab 02/09/17 0449 02/09/17 0725 02/09/17 1520 02/09/17 1932 02/09/17 2321 02/10/17 0323  GLUCAP 206* 169* 144* 127* 136* 107*    Imaging Ct Head Wo Contrast  Result Date: 02/09/2017 CLINICAL DATA:  Acute micro embolic infarctions. Worsening of mental status. EXAM: CT HEAD WITHOUT CONTRAST TECHNIQUE: Contiguous axial images were obtained from the base of the skull through the vertex without intravenous contrast. COMPARISON:  CT same day.  MRI yesterday. FINDINGS: Brain: Subtle areas of low-density now visible affecting the cerebral cortex bilaterally, most notable in the right frontoparietal junction region at the vertex. This is consistent with evolutionary change within the areas of acute infarction. There is no evidence of hemorrhage, mass effect or shift. No hydrocephalus or extra-axial collection. Vascular: There is atherosclerotic calcification of the major vessels at the base of the brain. Skull: Negative Sinuses/Orbits: Clear/normal Other: None IMPRESSION: Areas of low-density now visible by CT scattered within both cerebral hemispheres, most notable at the right frontoparietal vertex. No evidence of hemorrhage or mass effect. Electronically Signed   By: Nelson Chimes M.D.   On: 02/09/2017 18:08      STUDIES:  LHC 4/16 >> mild/mod 3 vessel disease, one concerning LAD culprit, no PCI -> medical managed. MRI/MRA Head 4/17 >> numerous infarcts in bilateral cerebellum and bilateral cerebral convexity c/w watershed/embolic pattern; no acute finding or flow limiting stenosis on intracranial MRA  TTE 4/18 >> normal LV size and EF 60-65%; no RWA, G2DD, mild calcification of AV annulus with mild thickening and calcification of the leafleats Head CT 4/18 am >> no changes  EEG 4/18 >> no epileptic activity  Head CT 4/18 pm >> areas of low density scattered within both cerebral hemispheres, most notable in right frontoparietal vertex; no hemorrhage or mass effect  CULTURES:   ANTIBIOTICS:   SIGNIFICANT EVENTS: 4/15 >> Admit w/Generalized weakness, dysphagia, NSTEMI 4/17 >> Code stroke called 4/18 >> PCCM consulted for airway concerns and worsening neuro status 4/19 >> Declining neuro status; DNR/ DNI  LINES/TUBES: Foley 4/18 >>  DISCUSSION:  65 yoM with history of ETOH abuse admited on 4/15  with generalized weakness, dysphagia, and NSTEMI.  Was taken for [facility] on 4/16 found to have 3 vessel disease and was managed medically.  On 4/17, he started to have left sided focal deficits, right facial twitching, and progressive decline in neuro exam.  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  Source of emboli unclear.  Moved to the ICU for further airway and neuro monitoring.   ASSESSMENT / PLAN:  PULMONARY A: Acute respiratory failure - in the setting of progressive neuro decline/ suspected aspiration  Hx former tobacco abuse P:   Titrate O2 as needed to keep sats >94% CXR with bilateral interstitial congestion  R > L CXR pending Awaiting the remainder of family to arrive to discuss goals/plan of care  CARDIOVASCULAR A:  NSTEMI - originally peaked on 4/16 CAD- LHC on 4/16 - EF 60-65% SVT w/ PAC vs PAF - new onset 4/17 overnight   Hx HTN, HLD P:  Continue monitoring hemodynamics Cardiology  following Will defer heparin vs lovenox to Cards and Neuro Metoprolol added with resolution of tachy arrthymias Troponin peaked this am at 9.84, now 7.78 Apresoline prn SBP > 220 Norvasc, ASA, lipitor, imdur per Cards   RENAL A:   Hypomagnesium Hx CKD3 P:   IVF at 100 ml/hr Trend mag Trend BMP / urinary output Replace electrolytes as indicated Avoid nephrotoxic agents, ensure adequate renal perfusion   GASTROINTESTINAL A:   Dysphagia-  P:   Failed SLP NPO Consider cortrak for TF  PPI for SUP GI following    HEMATOLOGIC A:   Anemia  Mild thrombocytopenia P:  Trend CBC Lovenox for DVT prophylaxis   INFECTIOUS A:   Leukocytosis - r/o inflammatory response vs infectious source  P:   Trend fever/WBC curve Check PCT CXR pending  ENDOCRINE A: No acute issues    Hx NIDDM (HA1c 4.9 on 02/06/17) P:   CBG while npo   NEUROLOGIC A:   Acute encephalopathy -  Acute CVA watershed/ embolic - source unclear with progressive decline in neuro exams ETOH abuse-  Facial twitching- r/o seizure P:   Neurology following Frequent neuro exams in ICU Continue MVI, thiamine and folate Continue Depacon Carotid doppler pending CIWA protocol- ativan  EXTREMITIES A:  R/o BLE arterial embolism- new mottling of BLE with diminished pulses 4/19 P:  Pending BLE arterial duplex   FAMILY  - Updates: Patient has a wife with alzheimer's, one son, and two daughters.  One daughter is currently at the bedside this morning with the remainder of her siblings on the way.  Daughter updated by Dr. Leonie Man and Dr. Elsworth Soho.  Will discuss further goals and plan of care when they arrive.  Patient remains a full code at this point.  - Inter-disciplinary family meet or Palliative Care meeting due by:  02/16/2017  CC time: 91 mins  Kennieth Rad, AGACNP-BC [facility] Pgr: [phone] or if no answer [phone] 02/10/2017, 8:03 AM

## 2017-02-10 NOTE — Progress Notes (Signed)
OT Cancellation Note  Patient Details Name: Charles Hanson. MRN: [id number] DOB: [birth year]   Cancelled Treatment:    Reason Eval/Treat Not Completed: Medical issues which prohibited therapy.  Pt with neurological decline.   Will follow up as appropriate.  [place], OTR/L [phone]   Lucille Passy M 02/10/2017, 12:40 PM

## 2017-02-10 NOTE — Progress Notes (Signed)
Progress Note  Patient Name: Charles Hanson. Date of Encounter: 02/10/2017  Primary Cardiologist: Branch  Subjective   Obtunded, does not respond to verbal stimuli.  Inpatient Medications    Scheduled Meds: . amLODipine  5 mg Oral Daily  . aspirin EC  81 mg Oral Daily  . atorvastatin  80 mg Oral q1800  . dorzolamide  1 drop Both Eyes BID  . feeding supplement (VITAL HIGH PROTEIN)  1,000 mL Per Tube Q24H  . folic acid  1 mg Intravenous Daily  . insulin aspart  0-9 Units Subcutaneous Q4H  . isosorbide mononitrate  30 mg Oral QHS  . latanoprost  1 drop Both Eyes QHS  . metoprolol  2.5 mg Intravenous Q4H  . multivitamin with minerals  1 tablet Oral Daily  . sodium chloride flush  3 mL Intravenous Q12H  . thiamine  100 mg Oral Daily   Or  . thiamine  100 mg Intravenous Daily  . traZODone  50 mg Oral QHS   Continuous Infusions: . sodium chloride Stopped (02/08/17 2230)  . sodium chloride    . ampicillin-sulbactam (UNASYN) IV    . valproate sodium 500 mg (02/10/17 0915)   PRN Meds: sodium chloride, acetaminophen, hydrALAZINE, LORazepam **OR** LORazepam, morphine injection, nitroGLYCERIN, ondansetron (ZOFRAN) IV, sodium chloride flush   Vital Signs    Vitals:   02/10/17 0600 02/10/17 0700 02/10/17 0800 02/10/17 0900  BP: (!) 156/86 128/77 (!) 168/99 (!) 175/108  Pulse:  80 98 (!) 110  Resp: (!) 29 (!) 26 (!) 30 (!) 36  Temp:      TempSrc:      SpO2: 94% 99% 99% 100%  Weight:      Height:        Intake/Output Summary (Last 24 hours) at 02/10/17 1129 Last data filed at 02/10/17 0915  Gross per 24 hour  Intake             2785 ml  Output              535 ml  Net             2250 ml   Filed Weights   02/08/17 0500 02/09/17 0437 02/09/17 1400  Weight: 170 lb 12.8 oz (77.5 kg) 172 lb 3.2 oz (78.1 kg) 172 lb 13.5 oz (78.4 kg)    Telemetry    SR with episodes of SVT vs PAF - Personally Reviewed  ECG    4/18 SR - Personally Reviewed  Physical Exam    General: Obtunded male appearing in no acute distress. Head: Normocephalic, atraumatic.  Neck: Supple without bruits, JVD. Lungs:  Resp regular and unlabored, Rhonchi on the right side. Heart: RRR, S1, S2, no S3, S4, or murmur; no rub. Abdomen: Soft, non-tender, non-distended with normoactive bowel sounds. No hepatomegaly. No rebound/guarding. No obvious abdominal masses. Extremities: No clubbing, cyanosis, edema. Distal pedal pulses are 1+ bilaterally, and cool to touch. Neuro: Obtunded Psych: Normal affect.  Labs    Chemistry Recent Labs Lab 02/06/17 1330  02/08/17 0350 02/09/17 0445 02/10/17 0131  NA  --   < > 133* 134* 137  K  --   < > 4.5 3.8 4.3  CL  --   < > 103 105 107  CO2  --   < > 23 22 22   GLUCOSE  --   < > 107* 207* 129*  BUN  --   < > 30* 21* 21*  CREATININE  --   < >  1.58* 1.51* 1.63*  CALCIUM  --   < > 8.3* 8.3* 8.3*  PROT 5.9*  --   --   --   --   ALBUMIN 2.7*  --   --   --   --   AST 35  --   --   --   --   ALT 29  --   --   --   --   ALKPHOS 128*  --   --   --   --   BILITOT 1.0  --   --   --   --   GFRNONAA  --   < > 41* 43* 39*  GFRAA  --   < > 47* 50* 45*  ANIONGAP  --   < > 7 7 8   < > = values in this interval not displayed.   Hematology Recent Labs Lab 02/08/17 0350 02/09/17 0445 02/10/17 0131  WBC 12.5* 15.9* 21.0*  RBC 3.09* 3.22* 3.53*  HGB 9.8* 10.0* 10.8*  HCT 28.8* 29.3* 32.5*  MCV 93.2 91.0 92.1  MCH 31.7 31.1 30.6  MCHC 34.0 34.1 33.2  RDW 12.7 12.3 12.5  PLT 190 162 183    Cardiac Enzymes Recent Labs Lab 02/09/17 1524 02/09/17 1916 02/10/17 0131 02/10/17 0729  TROPONINI 7.96* 9.32* 9.84* 7.78*    Recent Labs Lab 02/06/17 1226  TROPIPOC 0.89*     BNP Recent Labs Lab 02/06/17 1311  BNP 387.7*     DDimer No results for input(s): DDIMER in the last 168 hours.    Radiology    Ct Head Wo Contrast  Result Date: 02/09/2017 CLINICAL DATA:  Acute micro embolic infarctions. Worsening of mental status. EXAM:  CT HEAD WITHOUT CONTRAST TECHNIQUE: Contiguous axial images were obtained from the base of the skull through the vertex without intravenous contrast. COMPARISON:  CT same day.  MRI yesterday. FINDINGS: Brain: Subtle areas of low-density now visible affecting the cerebral cortex bilaterally, most notable in the right frontoparietal junction region at the vertex. This is consistent with evolutionary change within the areas of acute infarction. There is no evidence of hemorrhage, mass effect or shift. No hydrocephalus or extra-axial collection. Vascular: There is atherosclerotic calcification of the major vessels at the base of the brain. Skull: Negative Sinuses/Orbits: Clear/normal Other: None IMPRESSION: Areas of low-density now visible by CT scattered within both cerebral hemispheres, most notable at the right frontoparietal vertex. No evidence of hemorrhage or mass effect. Electronically Signed   By: Nelson Chimes M.D.   On: 02/09/2017 18:08   Ct Head Wo Contrast  Result Date: 02/09/2017 CLINICAL DATA:  Altered mental status. History of hypertension. Fatty tissue removed from back of head. EXAM: CT HEAD WITHOUT CONTRAST TECHNIQUE: Contiguous axial images were obtained from the base of the skull through the vertex without intravenous contrast. COMPARISON:  MRI brain 02/08/2017 FINDINGS: Brain: Mild cerebral atrophy. Ventricular dilatation consistent with central atrophy. Patchy low-attenuation changes in the deep white matter consistent small vessel ischemia. The multiple acute punctate infarcts demonstrated on MRI are not visualized at CT. No mass effect or midline shift. No abnormal extra-axial fluid collections. Gray-white matter junctions are distinct. Basal cisterns are not effaced. No acute intracranial hemorrhage. Vascular: No hyperdense vessel or unexpected calcification. Skull: Calvarium appears intact. Sinuses/Orbits: Mild mucosal thickening in the ethmoid air cells and right maxillary antrum. No acute  air-fluid levels. Mastoid air cells are not opacified. Other: None. IMPRESSION: No acute intracranial abnormalities demonstrated at CT. Chronic atrophy and small  vessel ischemic changes. Electronically Signed   By: Lucienne Capers M.D.   On: 02/09/2017 01:58   Mr Jodene Nam Head Wo Contrast  Result Date: 02/08/2017 CLINICAL DATA:  Left hand weakness.  Symptoms since Sunday. EXAM: MRI HEAD WITHOUT CONTRAST MRA HEAD WITHOUT CONTRAST TECHNIQUE: Multiplanar, multiecho pulse sequences of the brain and surrounding structures were obtained without intravenous contrast. Angiographic images of the head were obtained using MRA technique without contrast. COMPARISON:  None. FINDINGS: MRI HEAD FINDINGS Brain: Numerous 1 cm or smaller acute infarcts in the bilateral peripheral cerebellum and along both cerebral convexities, nearly innumerable. Strip of restricted diffusion along the high precentral gyrus on the right, 15 mm in length. Possible tiny left posterolateral medulla infarct. No hemorrhage, hydrocephalus, or mass. Mild background chronic small vessel ischemic change in the periventricular white matter. Vascular: Arterial findings below. Normal dural venous sinus flow voids. Skull and upper cervical spine: No marrow lesion noted. C2-3 facet arthropathy with mild anterolisthesis. Sinuses/Orbits: Bilateral cataract resection. T1 hyperintense material within the left petrous apex which does not appear expanded, likely trapped proteinaceous material. No restricted diffusion in this area. MRA HEAD FINDINGS Symmetric carotid artery size. Right ICA shelf-like appearance at the skullbase is likely artifactual based on source images. Strongly dominant right vertebral artery; the left vertebral artery ends in PICA. No major branch occlusion or reversible flow limiting stenosis. No notable atheromatous irregularity or evidence of inflammatory vasculopathy. Negative for aneurysm. IMPRESSION: 1. Numerous small acute infarcts in the  bilateral cerebellum and along the bilateral cerebral convexity, a watershed/embolic pattern. 2. Background age congruent cerebral volume loss and chronic microvascular ischemia. 3. No acute finding or flow limiting stenosis on intracranial MRA. Electronically Signed   By: Monte Fantasia M.D.   On: 02/08/2017 15:59   Mr Brain Wo Contrast  Result Date: 02/08/2017 CLINICAL DATA:  Left hand weakness.  Symptoms since Sunday. EXAM: MRI HEAD WITHOUT CONTRAST MRA HEAD WITHOUT CONTRAST TECHNIQUE: Multiplanar, multiecho pulse sequences of the brain and surrounding structures were obtained without intravenous contrast. Angiographic images of the head were obtained using MRA technique without contrast. COMPARISON:  None. FINDINGS: MRI HEAD FINDINGS Brain: Numerous 1 cm or smaller acute infarcts in the bilateral peripheral cerebellum and along both cerebral convexities, nearly innumerable. Strip of restricted diffusion along the high precentral gyrus on the right, 15 mm in length. Possible tiny left posterolateral medulla infarct. No hemorrhage, hydrocephalus, or mass. Mild background chronic small vessel ischemic change in the periventricular white matter. Vascular: Arterial findings below. Normal dural venous sinus flow voids. Skull and upper cervical spine: No marrow lesion noted. C2-3 facet arthropathy with mild anterolisthesis. Sinuses/Orbits: Bilateral cataract resection. T1 hyperintense material within the left petrous apex which does not appear expanded, likely trapped proteinaceous material. No restricted diffusion in this area. MRA HEAD FINDINGS Symmetric carotid artery size. Right ICA shelf-like appearance at the skullbase is likely artifactual based on source images. Strongly dominant right vertebral artery; the left vertebral artery ends in PICA. No major branch occlusion or reversible flow limiting stenosis. No notable atheromatous irregularity or evidence of inflammatory vasculopathy. Negative for aneurysm.  IMPRESSION: 1. Numerous small acute infarcts in the bilateral cerebellum and along the bilateral cerebral convexity, a watershed/embolic pattern. 2. Background age congruent cerebral volume loss and chronic microvascular ischemia. 3. No acute finding or flow limiting stenosis on intracranial MRA. Electronically Signed   By: Monte Fantasia M.D.   On: 02/08/2017 15:59   Dg Chest Port 1 View  Result Date: 02/10/2017 CLINICAL  DATA:  Shortness of Breath EXAM: PORTABLE CHEST 1 VIEW COMPARISON:  02/06/2017 FINDINGS: Borderline cardiomegaly. There is central vascular congestion, bilateral interstitial prominence and hazy central airspace opacification right greater than left. Findings highly suspicious for bilateral pulmonary edema rather than bilateral pneumonia. Question small left pleural effusion. IMPRESSION: There is central vascular congestion, bilateral interstitial prominence and hazy central airspace opacification right greater than left. Findings highly suspicious for bilateral pulmonary edema rather than bilateral pneumonia. Question small left pleural effusion. Electronically Signed   By: Lahoma Crocker M.D.   On: 02/10/2017 09:44    Cardiac Studies   Cath: 02/07/17  Conclusion     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  Prox LAD to Mid LAD lesion, 60 %stenosed - pre Diag, Mid LAD lesion, 50 %stenosed - post Diag (total length > 40 mm).  LV end diastolic pressure is mildly elevated.  There is no aortic valve stenosis.  The only really potential culprit lesion for troponin elevation in the setting of hypertension and no chest pain is the long combined lesion in the LAD. Angiographically, it is possible that the lesions in series both before and after the diagonal branch could cause anterior ischemia, however the patient is not truly had angina.  I reviewed these images with Dr. Claiborne Billings, who felt that the best course of action would be to  start with medical management using nitrates, Channel blockers and beta blockers to medically manage this moderate to severe disease. If symptoms persist, we could consider Myoview stress test to confirm or deny anterior ischemia.  This would allow for him to stabilize with hydration post catheterization.  Plan: Return to nursing unit for ongoing care  TR band removal per protocol.  The patient is significantly hypertensive which could be contributing to his elevated troponin. - Bystolic was discontinued secondary to bradycardia, may need to reassess restarting along with amlodipine.  I have added 30 mg Imdur.  PCI of the LAD would require a long stented segment that would jailed diagonal branch. It was the decision between both myself and Dr. Claiborne Billings, that this would not be a favorable option unless there was evidence of anterior ischemia. We decided to do this instead of an FFR, in an attempt to conserve contrast.    Patient Profile     76 y.o. male with 39 yr h/o DM, CKD stage 3, recent difficulty with HTN control who was admitted with somewhat atypical chest pain and also has had difficulty with swallowing food. Underwent cath, noted above. Reported left arm weakness prior to admission, MRI positive for CVA.   Assessment & Plan    1. Positive troponin: ECG showedinferior T wave changes. With cardiac risk factors including DM, HTN, remote tobacco, family history and troponin elevation with ECG findings, recommended definitive cath.  -- cath 02/07/17 showed p/mLAD lesion that was >81m in length. Decision was made to treat with medical therapy given the length of disease noted in the vessel. Imdur 343mwas added. -- Developed onset of rapid rate yesterday SVT vs Afib RVR. No reported chest pain, but lethargic. Follow up troponin 7.60, peaked at 9.84. -- neuro status significantly worse today and concern for airway protection. Family at the bedside has decided on DNR.   2. CKD  3:Followed by Dr. PaPosey ProntoReported baseline Cr 1.73.Cr is 1.58 post cath.   3. HTN:not well controlled. Not alert for POs, would continue with IV metoprolol  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  5. Hyperlipidemia: Statin changed to Lipitor 8m this admission.   6. 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 Neurology following. Family has made a DNR.  7. Tachycardia: Noted to have rapid rate this morning. SVT vs AF RVR on telemetry. Also VT. Metoprolol added yesterday not further VT, some rapid rates this morning. Echo this admission showed normal EF. -- continue IV metoprolol  Overall poor prognosis, family met and have made a DNR. PCCM following at this time.   Signed, LReino Bellis NP  02/10/2017, 11:29 AM    Patient seen and examined. Agree with assessment and plan. Not improving neurologically. No chest pain. ECG shows NSR in 90;s with inferolateral T changes. No AFib. Echo from 4/16 EF 60 - 65% with Gr 2 dd; no evidence for atrial enlargement.  For carotid duplex. No longer on heparin due to concern for potential hemorrhagic transformation of stroke. With troponin increased to 9.84 will re-check ECG today.   TTroy Sine MD, [facility]4/19/2018 1:24 PM

## 2017-02-10 NOTE — Progress Notes (Signed)
Patient remains hypertensive 170-180/100s, HR 90s. Patient was being evaluated by neurology who stated his condition is not improving. Pt also on CIWAs for possible EtOH withdrawal over next few days. CCM likely to intubate and take life-supporting measures. Waiting on family to arrive to make decision concerning course of action. Poor prognosis. Appreciate the great care CCM is providing for Mr. Rabenold. Will continue to follow. -- Harriet [place], [place], PGY-1

## 2017-02-10 NOTE — Progress Notes (Addendum)
ANTICOAGULATION CONSULT NOTE  Pharmacy Consult for Heparin Indication: atrial fibrillation and stroke  No Known Allergies  Patient Measurements: Height: 5\' 8"  (172.7 cm) Weight: 172 lb 13.5 oz (78.4 kg) IBW/kg (Calculated) : 68.4  Vital Signs: Temp: 98.8 F (37.1 C) (04/19 1236) Temp Source: Axillary (04/19 1236) BP: 175/108 (04/19 0900) Pulse Rate: 110 (04/19 0900)  Labs:  Recent Labs  02/07/17 1435  02/08/17 0350 02/09/17 0445  02/09/17 1916 02/10/17 0131 02/10/17 0729  HGB  --   < > 9.8* 10.0*  --   --  10.8*  --   HCT  --   --  28.8* 29.3*  --   --  32.5*  --   PLT  --   --  190 162  --   --  183  --   HEPARINUNFRC 0.32  --   --   --   --   --   --   --   CREATININE  --   --  1.58* 1.51*  --   --  1.63*  --   TROPONINI  --   --   --   --   < > 9.32* 9.84* 7.78*  < > = values in this interval not displayed.  Estimated Creatinine Clearance: 36.7 mL/min (A) (by C-G formula based on SCr of 1.63 mg/dL (H)).   Medical History: Past Medical History:  Diagnosis Date  . Chronic kidney disease   . Hypertension     Medications:  Scheduled:  . amLODipine  5 mg Oral Daily  . aspirin EC  81 mg Oral Daily  . atorvastatin  80 mg Oral q1800  . dorzolamide  1 drop Both Eyes BID  . feeding supplement (VITAL HIGH PROTEIN)  1,000 mL Per Tube Q24H  . folic acid  1 mg Intravenous Daily  . furosemide  40 mg Intravenous Once  . insulin aspart  0-9 Units Subcutaneous Q4H  . isosorbide mononitrate  30 mg Oral QHS  . latanoprost  1 drop Both Eyes QHS  . metoprolol  2.5 mg Intravenous Q4H  . multivitamin with minerals  1 tablet Oral Daily  . sodium chloride flush  3 mL Intravenous Q12H  . thiamine  100 mg Oral Daily   Or  . thiamine  100 mg Intravenous Daily  . traZODone  50 mg Oral QHS    Assessment: 76yo male with AFib and worsening neuro status with stroke and concern for seizures (recently with facial twitching). She is s/p cath for medical management only and with  poor prognosis. Pharmacy consulted to dose heparin.  -was on heparin 1150 units/hr with last heparin level= 0.32   Goal of Therapy:  Heparin level 0.3-0.5 units/ml Monitor platelets by anticoagulation protocol: Yes   Plan:  Restart heparin at1150 units/hr Heparin level 8hr Daily heparin level, CBC  Hildred Laser, Pharm D 02/10/2017 1:23 PM  Addendum:  -RN called: heparin was discontinued 4/18 due to risk of hemorrhagic transformation and per notes there is no documented evidence of afib -No heparin has been given  Plan -No further plans for heparin. All orders have been discontinued  Hildred Laser, Pharm D 02/10/2017 3:47 PM

## 2017-02-10 NOTE — Progress Notes (Signed)
PT Cancellation Note  Patient Details Name: Charles Hanson. MRN: [id number] DOB: 02/09/1941   Cancelled Treatment:    Reason Eval/Treat Not Completed: Patient not medically ready. Per RN, pt not medically appropriate for PT treatment today pending family discussion for plan of care. Will follow-up when appropriate.  [name], SPT Office-[phone]  [name] 02/10/2017, 9:24 AM

## 2017-02-10 NOTE — Evaluation (Signed)
Clinical/Bedside Swallow Evaluation Patient Details  Name: Charles Hanson. MRN: [id number] Date of Birth: 1941-04-04  Today's Date: 02/10/2017 Time: SLP Start Time (ACUTE ONLY): 4403 SLP Stop Time (ACUTE ONLY): 1353 SLP Time Calculation (min) (ACUTE ONLY): 6 min  Past Medical History:  Past Medical History:  Diagnosis Date  . Chronic kidney disease   . Hypertension    Past Surgical History:  Past Surgical History:  Procedure Laterality Date  . fatty tissue removed from back of head    . LEFT HEART CATH AND CORONARY ANGIOGRAPHY N/A 02/07/2017   Procedure: Left Heart Cath and Coronary Angiography;  Surgeon: Leonie Man, MD;  Location: [facility];  Service: Cardiovascular;  Laterality: N/A;  . NO PAST SURGERIES     HPI:  Pt is a 76 y.o. male presenting with generalized weakness, chest pain, and dysphagia. Positive for NSTEMI. Pt had questionable left-sided weakness on 4/17, with deterioration in neurological status that night including increased lethargy, left gaze, right neglect, facial twitching. MRI showed watershed infarcts.PMH is significant for HTN, HLD, CKDIII, NIDDM, EtOH abuse   Assessment / Plan / Recommendation Clinical Impression  Pt is lethargic and does not follow commands. Minimal responses include small oral movements with oral care and intermittent moaning to stimulation. He is not safe to attempt PO intake at this time given his current mental status. Cognitive-linguistic evaluation deferred at this time due to decreased level of alertness and pending discussions with family regarding GOC. SLP to f/u as warranted. SLP Visit Diagnosis: Dysphagia, unspecified (R13.10)    Aspiration Risk  Severe aspiration risk    Diet Recommendation NPO   Medication Administration: Via alternative means    Other  Recommendations Oral Care Recommendations: Oral care QID Other Recommendations: Have oral suction available   Follow up Recommendations  (tba)       Frequency and Duration min 2x/week  2 weeks       Prognosis Prognosis for Safe Diet Advancement: Fair Barriers to Reach Goals: Severity of deficits;Cognitive deficits      Swallow Study   General HPI: Pt is a 76 y.o. male presenting with generalized weakness, chest pain, and dysphagia. Positive for NSTEMI. Pt had questionable left-sided weakness on 4/17, with deterioration in neurological status that night including increased lethargy, left gaze, right neglect, facial twitching. MRI showed watershed infarcts.PMH is significant for HTN, HLD, CKDIII, NIDDM, EtOH abuse Type of Study: Bedside Swallow Evaluation Previous Swallow Assessment: none in chart but with c/o dysphagia upon admission Diet Prior to this Study: NPO;NG Tube Temperature Spikes Noted: No Respiratory Status: Nasal cannula History of Recent Intubation: No Behavior/Cognition: Lethargic/Drowsy;Doesn't follow directions Oral Care Completed by SLP: Yes Self-Feeding Abilities: Total assist Patient Positioning: Upright in bed Baseline Vocal Quality: Normal (during moaning) Volitional Cough: Cognitively unable to elicit Volitional Swallow: Unable to elicit    Oral/Motor/Sensory Function     Ice Chips Ice chips: Impaired Presentation: Spoon Oral Phase Impairments: Poor awareness of bolus;Other (comment) (no acceptance)   Thin Liquid Thin Liquid: Not tested    Nectar Thick Nectar Thick Liquid: Not tested   Honey Thick Honey Thick Liquid: Not tested   Puree Puree: Not tested   Solid   GO   Solid: Not tested        [name] 02/10/2017,2:09 PM  [name], M.A. CCC-SLP [phone]

## 2017-02-10 NOTE — Progress Notes (Signed)
STROKE TEAM PROGRESS NOTE   SUBJECTIVE (INTERVAL HISTORY) His 1daughter and dr Elsworth Soho are at the bedside.   His condition continues to decline and he remains up-to-date with dense left hemiplegia. This morning he has elevated white count with mottling of his distal extremities and may have developed aspiration. EEG done yesterday did not show definite seizure activity. Repeat CT scan of the head done yesterday evening showed resolution of his strokes but no hemorrhage and a very large new stroke  he is no longer having any twitchings on his face and his and Depacon for seizures. I discontinued IV heparin yesterday and there was no definite documented evidence of atrial fibrillation as per cardiology OBJECTIVE Temp:  [97.3 F (36.3 C)-98.8 F (37.1 C)] 98.8 F (37.1 C) (04/19 1236) Pulse Rate:  [77-110] 80 (04/19 1300) Cardiac Rhythm: Normal sinus rhythm (04/19 1200) Resp:  [23-36] 27 (04/19 1300) BP: (118-182)/(66-123) 118/73 (04/19 1300) SpO2:  [92 %-100 %] 97 % (04/19 1300)  CBC:   Recent Labs Lab 02/09/17 0445 02/10/17 0131  WBC 15.9* 21.0*  HGB 10.0* 10.8*  HCT 29.3* 32.5*  MCV 91.0 92.1  PLT 162 932    Basic Metabolic Panel:   Recent Labs Lab 02/09/17 0445 02/10/17 0131  NA 134* 137  K 3.8 4.3  CL 105 107  CO2 22 22  GLUCOSE 207* 129*  BUN 21* 21*  CREATININE 1.51* 1.63*  CALCIUM 8.3* 8.3*  MG 1.4* 2.3    PHYSICAL EXAM Frail elderly Caucasian male. who is restless and having intermittent right facial twitchings. . Afebrile. Head is nontraumatic. Neck is supple without bruit.    Cardiac exam no murmur or gallop. Lungs are clear to auscultation. Distal pulses are well felt. Neurological Exam :  Patient is lethargic but can be aroused to follow Only intermittent midline commands. He has left gaze preference and will not follow my eyes to the right. He blinks to threat on the right right partially and not on the left. Pupils are small and sluggishly reactive. Fundi  could not be visualized. Hie has mild left lower facial weakness. Tongue is midline. Patient will follow some midline as well as commands on the right side but not on the left. He has dense left hemiplegia with only trace withdrawal to pain in the left lower extremity and left hand. He has purposeful antigravity movements on the right. Deep tendon reflexes are depressed on the left present on the right. Left plantar upgoing right downgoing. Sensation appears preserved on the right hand may be diminished on the left. Gait not tested. ASSESSMENT/PLAN Charles Hanson. is a 76 y.o. male with history of progressive L hand weakness, admitted with CP s/p cardiac cath who was noted to have L hand weakness by PT. He did not receive IV t-PA due to unknown LKW.   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 Additional infarcts in hospital with neuro worsening during the night  Resultant  Encephalopathy, L hemiparesis  MRI  B Cerebellar and B cerebral embolic Infarcts. And changes of  small vessel disease.   MRA Unremarkable   CT head after neuro worsening no acute abnormalitites. small vessel disease. Atrophy  Carotid Doppler  pending   2D Echo  EF 60-65%. No source of embolus   LDL 112  HgbA1c 4.9  Heparin 5000 units sq tid for  VTE prophylaxis Diet NPO time specified  No antithrombotic prior to admission, now on aspirin 81 mg daily  Therapy recommendations:  HH therapies - will need to reassess once stable  Disposition:  pending  (lives w/ wife who has Alzheimers, he helps provide care)    R Facial Twitching, Possible seizure  New this am  Ativan 0.5 mg now with resolved twitching  depacon 1gm IV now followed by 500mg   q 8  Check EEG  Hypertension  Noncompliant with meds PTA, not taking consistently  Stable  Permissive hypertension (OK if  < 220/120) but gradually normalize in 5-7 days  Long-term BP goal normotensive  Hyperlipidemia  Home meds:  zocor 20, resumed in hospital  LDL 112 goal  Consider increasing  Continue statin at discharge  ETOH abuse, withdrawal  On CIWA protocol  Other Stroke Risk Factors  Advanced age  Former Cigarette smoker  UDS / ETOH screen not performed  Other Active Problems  Atypical CP, inferior-NSTEMI  AKI, CKD stage III  I discussed diagnosis, prognosis,  treatment options and plan of care with pt, daughter, RN and Dr. Yisroel Ramming. and  Dr Elsworth Soho.I have personally examined this patient, reviewed notes, independently viewed imaging studies, participated in medical decision making and plan of care.ROS completed by me personally and pertinent positives fully documented  I have made any additions or clarifications directly to the above note. Agree with note above. Charles Hanson He presented with subacute right hemispheric infarcts couple of days prior to presentation and had neurological worsening following cardiac catheterization with MRI showing bi-cerebral embolic infarcts.  The patient's prognosis appears to be quite were given his multiple medical problems. Family to decide on level of care agree with DO NOT RESUSCITATE but continuing medical care for now. This patient is critically ill and at significant risk of neurological worsening, death and care requires constant monitoring of vital signs, hemodynamics,respiratory and cardiac monitoring, extensive review of multiple databases, frequent neurological assessment, discussion with family, other specialists and medical decision making of high complexity.I have made any additions or clarifications directly to the above note.This critical care time does not reflect procedure time, or teaching time or supervisory time of PA/NP/Med Resident etc but could involve care discussion time.  I spent 35 minutes of neurocritical care time  in the care of  this  patient.      Antony Contras, MD Medical Director [facility] Pager: [phone] 02/10/2017 2:17 PM   Hospital day # 4     To contact Stroke Continuity provider, please refer to [email or web address] After hours, contact General Neurology

## 2017-02-10 NOTE — Progress Notes (Signed)
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 Unable to accurately obtain left ABI due to a non-compressible posterior tibial artery. The left pedal arteries demonstrate biphasic waveforms.   RIGHT    LEFT    PRESSURE WAVEFORM  PRESSURE WAVEFORM  BRACHIAL 168 Biphasic BRACHIAL IV Biphasic  DP   DP 154 Biphasic  AT 80 Monophasic AT    PT 99 Monophasic PT >254 Biphasic    RIGHT LEFT  ABI 0.59      Legrand Como, RVT 02/10/2017, 11:01 AM

## 2017-02-11 DIAGNOSIS — J9601 Acute respiratory failure with hypoxia: Secondary | ICD-10-CM

## 2017-02-11 LAB — CBC
HCT: 30.4 % — ABNORMAL LOW (ref 39.0–52.0)
Hemoglobin: 10.2 g/dL — ABNORMAL LOW (ref 13.0–17.0)
MCH: 30.6 pg (ref 26.0–34.0)
MCHC: 33.6 g/dL (ref 30.0–36.0)
MCV: 91.3 fL (ref 78.0–100.0)
PLATELETS: 195 10*3/uL (ref 150–400)
RBC: 3.33 MIL/uL — ABNORMAL LOW (ref 4.22–5.81)
RDW: 12.8 % (ref 11.5–15.5)
WBC: 19.7 10*3/uL — AB (ref 4.0–10.5)

## 2017-02-11 LAB — GLUCOSE, CAPILLARY
GLUCOSE-CAPILLARY: 162 mg/dL — AB (ref 65–99)
GLUCOSE-CAPILLARY: 163 mg/dL — AB (ref 65–99)
GLUCOSE-CAPILLARY: 181 mg/dL — AB (ref 65–99)
Glucose-Capillary: 174 mg/dL — ABNORMAL HIGH (ref 65–99)
Glucose-Capillary: 193 mg/dL — ABNORMAL HIGH (ref 65–99)
Glucose-Capillary: 198 mg/dL — ABNORMAL HIGH (ref 65–99)
Glucose-Capillary: 208 mg/dL — ABNORMAL HIGH (ref 65–99)
Glucose-Capillary: 210 mg/dL — ABNORMAL HIGH (ref 65–99)

## 2017-02-11 LAB — TROPONIN I
TROPONIN I: 8.82 ng/mL — AB (ref <0.03)
Troponin I: 7.71 ng/mL (ref <0.03)

## 2017-02-11 LAB — PHOSPHORUS: Phosphorus: 2.5 mg/dL (ref 2.5–4.6)

## 2017-02-11 LAB — PROCALCITONIN: PROCALCITONIN: 1.91 ng/mL

## 2017-02-11 MED ORDER — PRO-STAT SUGAR FREE PO LIQD
30.0000 mL | Freq: Every day | ORAL | Status: DC
  Administered 2017-02-11 – 2017-02-12 (×2): 30 mL
  Filled 2017-02-11 (×2): qty 30

## 2017-02-11 MED ORDER — JEVITY 1.2 CAL PO LIQD
1000.0000 mL | ORAL | Status: DC
  Administered 2017-02-11: 40 mL/h
  Administered 2017-02-12: 1000 mL
  Filled 2017-02-11 (×5): qty 1000

## 2017-02-11 MED ORDER — DILTIAZEM HCL 100 MG IV SOLR
5.0000 mg/h | INTRAVENOUS | Status: DC
  Administered 2017-02-11 – 2017-02-12 (×3): 5 mg/h via INTRAVENOUS
  Filled 2017-02-11 (×4): qty 100

## 2017-02-11 MED ORDER — METOPROLOL TARTRATE 5 MG/5ML IV SOLN
5.0000 mg | INTRAVENOUS | Status: DC
  Administered 2017-02-11 – 2017-02-13 (×10): 5 mg via INTRAVENOUS
  Filled 2017-02-11 (×10): qty 5

## 2017-02-11 MED ORDER — INSULIN ASPART 100 UNIT/ML ~~LOC~~ SOLN
0.0000 [IU] | SUBCUTANEOUS | Status: DC
  Administered 2017-02-11 – 2017-02-12 (×6): 3 [IU] via SUBCUTANEOUS
  Administered 2017-02-12 (×2): 5 [IU] via SUBCUTANEOUS
  Administered 2017-02-12: 3 [IU] via SUBCUTANEOUS
  Administered 2017-02-13: 8 [IU] via SUBCUTANEOUS
  Administered 2017-02-13: 11 [IU] via SUBCUTANEOUS
  Administered 2017-02-13: 5 [IU] via SUBCUTANEOUS

## 2017-02-11 MED ORDER — FUROSEMIDE 10 MG/ML IJ SOLN
40.0000 mg | Freq: Every day | INTRAMUSCULAR | Status: DC
  Administered 2017-02-11: 40 mg via INTRAVENOUS
  Filled 2017-02-11: qty 4

## 2017-02-11 MED ORDER — HEPARIN (PORCINE) IN NACL 100-0.45 UNIT/ML-% IJ SOLN
1050.0000 [IU]/h | INTRAMUSCULAR | Status: DC
  Administered 2017-02-11: 1150 [IU]/h via INTRAVENOUS
  Administered 2017-02-12: 1250 [IU]/h via INTRAVENOUS
  Filled 2017-02-11 (×3): qty 250

## 2017-02-11 NOTE — Evaluation (Signed)
Occupational Therapy Evaluation Patient Details Name: Charles Hanson. MRN: [id number] DOB: January 03, 1941 Today's Date: 02/11/2017    History of Present Illness generalized weakness and chest pain. Positive for NSTEMI.  PMH is significant for HTN, HLD, CKDIII, NIDDM.  Pt develooped Lt hand weakness 02/08/17 then progressive weakness with decreased arousal 02/09/17 as well as episodes of intermittent A-Fib, SVT, V-tach with elevated troponins with inteferior T wave changes on ECG.  MRI of brain 02/08/17 showed Numerous small acute infarcts in the bilateral cerebellum and   Clinical Impression   Pt admitted with above. He demonstrates the below listed deficits and will benefit from continued OT to maximize safety and independence with BADLs.  Pt minimally responsive.  He withdraws to pain all 4 extrems, and opens eyes partially in response to maximal stimuli, however, eyes do not fixate.  He follows no commands, and does not engage in surroundings.  He requires total A for ADLs and mobility.  Family present during eval.   Will place him on 2 week trial of OT to gauge his ability to participate and progress.  Anticipate he will require SNF level rehab at discharge if he maintains medical stability.        Follow Up Recommendations  SNF    Equipment Recommendations  None recommended by OT    Recommendations for Other Services       Precautions / Restrictions Precautions Precautions: Fall      Mobility Bed Mobility Overal bed mobility: Needs Assistance Bed Mobility: Supine to Sit;Sit to Supine     Supine to sit: Total assist Sit to supine: Total assist;+2 for physical assistance   General bed mobility comments: pt unable to assist   Transfers                 General transfer comment: unable     Balance Overall balance assessment: Needs assistance Sitting-balance support: Feet supported;Single extremity supported Sitting balance-Leahy Scale: Zero Sitting balance - Comments:  Pt requires total A to sit EOB.  Pt does not attempt to initiate righting reactions or sit upright - flaccid trunk                                    ADL either performed or assessed with clinical judgement   ADL Overall ADL's : Needs assistance/impaired Eating/Feeding: NPO   Grooming: Total assistance   Upper Body Bathing: Total assistance   Lower Body Bathing: Total assistance   Upper Body Dressing : Total assistance   Lower Body Dressing: Total assistance   Toilet Transfer: Total assistance   Toileting- Clothing Manipulation and Hygiene: Total assistance       Functional mobility during ADLs: Total assistance General ADL Comments: Pt does not engage with environment.   Total A with all      Vision   Additional Comments: Pt opens eyes partially.  eyes roll superiorly under lids.  Does not fixate gaze.   with passive head turns, nystagmus was noted and roving/lolling eye movements were noted      Perception Perception Perception Tested?: No Comments: unable    Praxis Praxis Praxis tested?: Not tested Praxis-Other Comments: unable to assess     Pertinent Vitals/Pain Pain Assessment: Faces Faces Pain Scale: No hurt     Hand Dominance Right   Extremity/Trunk Assessment Upper Extremity Assessment Upper Extremity Assessment: RUE deficits/detail;LUE deficits/detail RUE Deficits / Details: no spontaneous movement noted.  PROM [facility]  RUE Coordination: decreased fine motor;decreased gross motor LUE Deficits / Details: no spontaneous movement noted.  PROM WFL LUE Coordination: decreased fine motor;decreased gross motor   Lower Extremity Assessment Lower Extremity Assessment: Defer to PT evaluation   Cervical / Trunk Assessment Cervical / Trunk Assessment: Other exceptions Cervical / Trunk Exceptions: no active trunk, head/neck activation    Communication Communication Communication: Receptive difficulties;Expressive difficulties (does not respond )    Cognition Arousal/Alertness: Lethargic Behavior During Therapy: Flat affect Overall Cognitive Status: Impaired/Different from baseline                                 General Comments: Pt withdraws to pain all 4 extrems.  Opens eyes partially, but eyes rolled upwardly with no visual fixation noted.  Follows no commands.    General Comments  VSS.  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        OT Problem List: Decreased strength;Decreased range of motion;Decreased activity tolerance;Impaired balance (sitting and/or standing);Impaired vision/perception;Decreased coordination;Decreased cognition;Decreased safety awareness;Decreased knowledge of use of DME or AE;Cardiopulmonary status limiting activity;Impaired tone;Impaired UE functional use      OT Treatment/Interventions: Self-care/ADL training;Neuromuscular education;DME and/or AE instruction;Splinting;Therapeutic activities;Cognitive remediation/compensation;Visual/perceptual remediation/compensation;Patient/family education;Balance training;Manual therapy;Energy conservation    OT Goals(Current goals can be found in the care  plan section) Acute Rehab OT Goals Patient Stated Goal: "watch and see how he does"  they're hope is that he will improve  OT Goal Formulation: With family Time For Goal Achievement: 02/25/17 Potential to Achieve Goals: Fair ADL Goals Additional ADL Goal #1: Pt will follow one step commands 25 % of time in context of ADLs  Additional ADL Goal #2: Pt will sit EOB with max A x 5 mins in prep for ADLs  Additional ADL Goal #3: Family will be independent with PROM of bil. UEs   OT Frequency: Min 2X/week   Barriers to D/C: Decreased caregiver support          Co-evaluation PT/OT/SLP Co-Evaluation/Treatment: Yes Reason for Co-Treatment: Complexity of the patient's impairments (multi-system involvement);For patient/therapist safety;Necessary to address cognition/behavior during functional activity   OT goals addressed during session: ADL's and self-care      End of Session Equipment Utilized During Treatment: Oxygen Nurse Communication: Mobility status  Activity Tolerance: Patient limited by lethargy Patient left: in bed;with call bell/phone within reach;with bed alarm set;with family/visitor present  OT Visit Diagnosis: Hemiplegia and hemiparesis Hemiplegia - Right/Left: Left Hemiplegia - dominant/non-dominant: Non-Dominant Hemiplegia - caused by: Cerebral infarction                Time: 6387-5643 OT Time  Calculation (min): 44 min Charges:  OT General Charges $OT Visit: 1 Procedure OT Evaluation $OT Eval Moderate Complexity: 1 Procedure OT Treatments $Therapeutic Activity: 8-22 mins G-Codes:     [facility], OTR/L [phone]   Asbury Hair M 02/11/2017, 3:02 PM

## 2017-02-11 NOTE — Progress Notes (Signed)
Progress Note  Patient Name: Charles Hanson. Date of Encounter: 02/11/2017  Primary Cardiologist: Dr. Harl Bowie  Subjective   Pt unable to respond to questions, obtunded.  Inpatient Medications    Scheduled Meds: . aspirin EC  81 mg Oral Daily  . atorvastatin  80 mg Oral q1800  . dorzolamide  1 drop Both Eyes BID  . feeding supplement (VITAL HIGH PROTEIN)  1,000 mL Per Tube Q24H  . folic acid  1 mg Intravenous Daily  . insulin aspart  0-15 Units Subcutaneous Q4H  . isosorbide mononitrate  30 mg Oral QHS  . latanoprost  1 drop Both Eyes QHS  . metoprolol  2.5 mg Intravenous Q4H  . multivitamin with minerals  1 tablet Oral Daily  . sodium chloride flush  3 mL Intravenous Q12H  . thiamine  100 mg Oral Daily   Or  . thiamine  100 mg Intravenous Daily  . traZODone  50 mg Oral QHS   Continuous Infusions: . sodium chloride    . ampicillin-sulbactam (UNASYN) IV Stopped (02/11/17 5498)  . diltiazem (CARDIZEM) infusion 10 mg/hr (02/11/17 0630)  . valproate sodium Stopped (02/11/17 0603)   PRN Meds: sodium chloride, acetaminophen, hydrALAZINE, LORazepam **OR** LORazepam, morphine injection, nitroGLYCERIN, ondansetron (ZOFRAN) IV, sodium chloride flush   Vital Signs    Vitals:   02/11/17 0747 02/11/17 0800 02/11/17 0900 02/11/17 1110  BP: 114/66 (!) 145/88 134/85   Pulse: 73 93 86   Resp: (!) 21 (!) 27 (!) 31   Temp: 98.2 F (36.8 C)   97.5 F (36.4 C)  TempSrc: Axillary   Axillary  SpO2: 98% 96% 97%   Weight:      Height:        Intake/Output Summary (Last 24 hours) at 02/11/17 1134 Last data filed at 02/11/17 0900  Gross per 24 hour  Intake          1181.83 ml  Output              550 ml  Net           631.83 ml   Filed Weights   02/09/17 0437 02/09/17 1400 02/11/17 0500  Weight: 172 lb 3.2 oz (78.1 kg) 172 lb 13.5 oz (78.4 kg) 177 lb 14.6 oz (80.7 kg)     Physical Exam   General: Well developed, well nourished, male appearing in no acute distress. Head:  Normocephalic, atraumatic.  Neck: Supple without bruits, no JVD Lungs:  Resp regular and unlabored, CTA. Heart: RRR, S1, S2, no murmur; no rub. Abdomen: Soft, non-tender, non-distended with normoactive bowel sounds. No hepatomegaly. No rebound/guarding. No obvious abdominal masses. Extremities: No clubbing, cyanosis, edema. Distal pedal pulses are 2+ bilaterally. Neuro: Alert and oriented X 3. Moves all extremities spontaneously. Psych: Normal affect.  Labs    Chemistry Recent Labs Lab 02/06/17 1330  02/08/17 0350 02/09/17 0445 02/10/17 0131  NA  --   < > 133* 134* 137  K  --   < > 4.5 3.8 4.3  CL  --   < > 103 105 107  CO2  --   < > 23 22 22   GLUCOSE  --   < > 107* 207* 129*  BUN  --   < > 30* 21* 21*  CREATININE  --   < > 1.58* 1.51* 1.63*  CALCIUM  --   < > 8.3* 8.3* 8.3*  PROT 5.9*  --   --   --   --  ALBUMIN 2.7*  --   --   --   --   AST 35  --   --   --   --   ALT 29  --   --   --   --   ALKPHOS 128*  --   --   --   --   BILITOT 1.0  --   --   --   --   GFRNONAA  --   < > 41* 43* 39*  GFRAA  --   < > 47* 50* 45*  ANIONGAP  --   < > 7 7 8   < > = values in this interval not displayed.   Hematology Recent Labs Lab 02/09/17 0445 02/10/17 0131 02/11/17 0245  WBC 15.9* 21.0* 19.7*  RBC 3.22* 3.53* 3.33*  HGB 10.0* 10.8* 10.2*  HCT 29.3* 32.5* 30.4*  MCV 91.0 92.1 91.3  MCH 31.1 30.6 30.6  MCHC 34.1 33.2 33.6  RDW 12.3 12.5 12.8  PLT 162 183 195    Cardiac Enzymes Recent Labs Lab 02/09/17 1524 02/09/17 1916 02/10/17 0131 02/10/17 0729  TROPONINI 7.96* 9.32* 9.84* 7.78*    Recent Labs Lab 02/06/17 1226  TROPIPOC 0.89*     BNP Recent Labs Lab 02/06/17 1311  BNP 387.7*     DDimer No results for input(s): DDIMER in the last 168 hours.   Radiology    Ct Head Wo Contrast  Result Date: 02/09/2017 CLINICAL DATA:  Acute micro embolic infarctions. Worsening of mental status. EXAM: CT HEAD WITHOUT CONTRAST TECHNIQUE: Contiguous axial images were  obtained from the base of the skull through the vertex without intravenous contrast. COMPARISON:  CT same day.  MRI yesterday. FINDINGS: Brain: Subtle areas of low-density now visible affecting the cerebral cortex bilaterally, most notable in the right frontoparietal junction region at the vertex. This is consistent with evolutionary change within the areas of acute infarction. There is no evidence of hemorrhage, mass effect or shift. No hydrocephalus or extra-axial collection. Vascular: There is atherosclerotic calcification of the major vessels at the base of the brain. Skull: Negative Sinuses/Orbits: Clear/normal Other: None IMPRESSION: Areas of low-density now visible by CT scattered within both cerebral hemispheres, most notable at the right frontoparietal vertex. No evidence of hemorrhage or mass effect. Electronically Signed   By: Nelson Chimes M.D.   On: 02/09/2017 18:08   Dg Chest Port 1 View  Result Date: 02/10/2017 CLINICAL DATA:  Shortness of Breath EXAM: PORTABLE CHEST 1 VIEW COMPARISON:  02/06/2017 FINDINGS: Borderline cardiomegaly. There is central vascular congestion, bilateral interstitial prominence and hazy central airspace opacification right greater than left. Findings highly suspicious for bilateral pulmonary edema rather than bilateral pneumonia. Question small left pleural effusion. IMPRESSION: There is central vascular congestion, bilateral interstitial prominence and hazy central airspace opacification right greater than left. Findings highly suspicious for bilateral pulmonary edema rather than bilateral pneumonia. Question small left pleural effusion. Electronically Signed   By: Lahoma Crocker M.D.   On: 02/10/2017 09:44   Dg Abd Portable 1v  Result Date: 02/10/2017 CLINICAL DATA:  ET tube placement EXAM: PORTABLE ABDOMEN - 1 VIEW COMPARISON:  None in PACs FINDINGS: The radiodense tipped feeding tube projects in the region of the third portion of the duodenum. There are loops of mildly  distended gas-filled small bowel in the midline. There is a small amount of gas in the stomach and colon. IMPRESSION: The feeding tube tip projects in the region of the third portion of the duodenum. Electronically  Signed   By: David  Martinique M.D.   On: 02/10/2017 15:14     Telemetry    Bouts of Afib RVR vs SVT, currently SR - Personally Reviewed  ECG    02/11/17: TWI in Lead I, II, and AVF, down sloping T wave in precordial leads, prolonged QTc - Personally Reviewed   Cardiac Studies   Left Heart Cath 02/07/17:  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  Prox LAD to Mid LAD lesion, 60 %stenosed - pre Diag, Mid LAD lesion, 50 %stenosed - post Diag (total length > 40 mm).  LV end diastolic pressure is mildly elevated.  There is no aortic valve stenosis.   The only really potential culprit lesion for troponin elevation in the setting of hypertension and no chest pain is the long combined lesion in the LAD.  Angiographically, it is possible that the lesions in series both before and after the diagonal branch could cause anterior ischemia, however the patient is not truly had angina.  I reviewed these images with Dr. Claiborne Billings, who felt that the best course of action would be to start with medical management using nitrates, Channel blockers and beta blockers to medically manage this moderate to severe disease. If symptoms persist, we could consider Myoview stress test to confirm or deny anterior ischemia.  This would allow for him to stabilize with hydration post catheterization.  Plan: Return to nursing unit for ongoing care  TR band removal per protocol.  The patient is significantly hypertensive which could be contributing to his elevated troponin. - Bystolic was discontinued secondary to bradycardia, may need to reassess restarting along with amlodipine.  I have added 30 mg Imdur.  PCI of the LAD would require a long stented segment  that would jailed diagonal branch. It was the decision between both myself and Dr. Claiborne Billings, that this would not be a favorable option unless there was evidence of anterior ischemia. We decided to do this instead of an FFR, in an attempt to conserve contrast.   Echo 02/07/17: Study Conclusions - Left ventricle: The cavity size was normal. Wall thickness was   normal. Systolic function was normal. The estimated ejection   fraction was in the range of 60% to 65%. Wall motion was normal;   there were no regional wall motion abnormalities. Features are   consistent with a pseudonormal left ventricular filling pattern,   with concomitant abnormal relaxation and increased filling   pressure (grade 2 diastolic dysfunction). - Aortic valve: Mildly calcified annulus. Mildly thickened, mildly   calcified leaflets. There was trivial regurgitation. - Pulmonary arteries: Systolic pressure was mildly increased. PA   peak pressure: 34 mm Hg (S).   Patient Profile     76 y.o. male with 11 yr h/o DM, CKD stage 3, recent difficulty with HTN control who was admitted with somewhat atypical chest pain and also has had difficulty with swallowing food. Underwent cath, noted above. Reported left arm weakness prior to admission, MRI positive for CVA.  Assessment & Plan    1. Positive troponin: ECG showedinferior T wave changes and down-sloping in precordial leads. With cardiac risk factors including DM, HTN, remote tobacco, family history and troponin elevation with ECG findings, recommendeddefinitive cath.  -- cath 4/16/18showed p/mLAD lesion that was >59m in length. Decision was made to treat with medical therapy given the length of disease noted in the vessel. Imdur 346mwas added. -- Developed onset of rapid rate yesterday SVT vs Afib RVR.  No reported chest pain, but lethargic. Follow up troponin 7.60, peaked at 9.84. -- Given neuro status, family at the bedside has decided on DNR.   2. CKD 3:Followed by  Dr. Posey Pronto. Reported baseline Cr 1.73.Cr is 1.58 post cath.   3. HTN:not well controlled. Not alert for POs, would continue with IV metoprolol  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  5. Hyperlipidemia: Statin changed to Lipitor 10m this admission.   6. 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 Neurology following. Family has made a DNR.  7. Tachycardia:Noted to have rapid rate this morning. SVT vs AF RVR on telemetry. Metoprolol added yesterday with no further VT, some rapid rates this morning. Echo this admission showed normal EF. -- continue IV metoprolol  -- started cardizem drip this morning for better rate control, currently running at 569mhr, he is NSR in the 90s -- recommend transitioning this cardizem drip to 30 mg cardizem q6hr - pills can be crushed and administered via feeding tube. This will allow him to come off the drip and transition to step down status with steady state rate control agent - recommend order for 5 mg lopressor IV q4 hrs for SVT vs Afib RVR    Overall poor prognosis, family met and have made a DNR. PCCM following at this time.   Signed, AnLedora Bottcher PA-C 11:34 AM 02/11/2017 Pager: [phone] seen and examined. Agree with assessment and plan. Although prior rhythm strips have shown sinus with PACs , monitor strips from 4 am today suggest AFwith rates up to 168. . Marland KitchenCG ay 4:51 :43 most likely is sinus rhythm with PAC's rather that AF.  BP elevated, currently on 2.5 mg lopressor q 4 hrs, and Cardizem drip at 5 mg/h.  ECG now shows NSR at 79 with progressive anterior T waves inversion c/w LAD ischemia.  I spoke with Dr. SeLeonie ManWill re- heparinize for PAF and NSTEMI. QTc prolonged. Increase lopressor 5 mg iv q 4. Will re-check Troponins and ECG serially. Currently NPO. Continue iv cardizem. Time spent 35 minutes.   ThTroy SineMD, FACape Regional Medical Center/20/2018 4:08 PM

## 2017-02-11 NOTE — Progress Notes (Signed)
Patient now DNR. VS more stable today. Remains to with normotensive pressures. Sinus rhythm. Patient moaning on exam with minimal movement. Neurological status appears to be unchanged. Will continue to follow. Discussed updates with patient's daughters and son on situation. Appreciate the great care critical care is providing for Mr. Rayburn. -- Harriet Butte, Sophia, PGY-1

## 2017-02-11 NOTE — Progress Notes (Signed)
STROKE TEAM PROGRESS NOTE   SUBJECTIVE (INTERVAL HISTORY) His 2 daughters and dr Elsworth Soho are at the bedside.   His condition remains  unchanged. He has been started on drip for supraventricular tachycardia. Family has agreed to DO NOT RESUSCITATE and would like to support him for the next few days and see how he does but would lean towards comfort care if his condition declines further  OBJECTIVE Temp:  [97.5 F (36.4 C)-99.1 F (37.3 C)] 97.5 F (36.4 C) (04/20 1110) Pulse Rate:  [64-120] 74 (04/20 1400) Cardiac Rhythm: Normal sinus rhythm (04/20 1200) Resp:  [21-38] 23 (04/20 1400) BP: (97-178)/(59-112) 134/79 (04/20 1300) SpO2:  [94 %-100 %] 97 % (04/20 1400) Weight:  [177 lb 14.6 oz (80.7 kg)] 177 lb 14.6 oz (80.7 kg) (04/20 0500)  CBC:   Recent Labs Lab 02/10/17 0131 02/11/17 0245  WBC 21.0* 19.7*  HGB 10.8* 10.2*  HCT 32.5* 30.4*  MCV 92.1 91.3  PLT 183 223    Basic Metabolic Panel:   Recent Labs Lab 02/09/17 0445 02/10/17 0131  NA 134* 137  K 3.8 4.3  CL 105 107  CO2 22 22  GLUCOSE 207* 129*  BUN 21* 21*  CREATININE 1.51* 1.63*  CALCIUM 8.3* 8.3*  MG 1.4* 2.3    PHYSICAL EXAM Frail elderly Caucasian male. who is restless and having intermittent right facial twitchings. . Afebrile. Head is nontraumatic. Neck is supple without bruit.    Cardiac exam no murmur or gallop. Lungs are clear to auscultation. Distal pulses are well felt. Neurological Exam :  Patient is lethargic but can be aroused but will not follow any commands. He has left gaze preference and will not follow my eyes to the right. He blinks to threat on the right right partially and not on the left. Pupils are small and sluggishly reactive. Fundi could not be visualized. Hie has mild left lower facial weakness. Tongue is midline. Patient will follow some midline as well as commands on the right side but not on the left. He has dense left hemiplegia with only trace withdrawal to pain in the left lower  extremity and left hand. He has purposeful antigravity movements on the right. Deep tendon reflexes are depressed on the left present on the right. Left plantar upgoing right downgoing. Sensation appears preserved on the right hand may be diminished on the left. Gait not tested. ASSESSMENT/PLAN [name]. is a 76 y.o. male with history of progressive L hand weakness, admitted with CP s/p cardiac cath who was noted to have L hand weakness by PT. He did not receive IV t-PA due to unknown LKW.   Stroke:  B Cerebellar and B cerebral  infarcts embolic secondary to undetermined source possibly his acute cardiac ischemia   Infarct likely present PTA, may be up to 57-26 weeks old. Additional infarcts in hospital with neuro worsening during the night  Resultant  Encephalopathy, L hemiparesis  MRI  B Cerebellar and B cerebral embolic Infarcts. And changes of  small vessel disease.   MRA Unremarkable   CT head after neuro worsening no acute abnormalitites. small vessel disease. Atrophy  Carotid Doppler  pending   2D Echo  EF 60-65%. No source of embolus   LDL 112  HgbA1c 4.9  Heparin 5000 units sq tid for VTE prophylaxis Diet NPO time specified  No antithrombotic prior to admission, now on aspirin 81 mg daily  Therapy recommendations:  HH therapies - will need to reassess once stable  Disposition:  pending  (lives w/ wife who has Alzheimers, he helps provide care)    R Facial Twitching, Possible seizure  New this am  Ativan 0.5 mg now with resolved twitching  depacon 1gm IV now followed by 500mg   q 8  Check EEG  Hypertension  Noncompliant with meds PTA, not taking consistently  Stable  Permissive hypertension (OK if < 220/120) but gradually normalize in 5-7 days  Long-term BP goal normotensive  Hyperlipidemia  Home meds:  zocor 20, resumed in hospital  LDL 112 goal  Consider increasing  Continue statin at discharge  ETOH abuse, withdrawal  On CIWA  protocol  Other Stroke Risk Factors  Advanced age  Former Cigarette smoker  UDS / ETOH screen not performed  Other Active Problems  Atypical CP, inferior-NSTEMI  AKI, CKD stage III  I discussed diagnosis, prognosis,  treatment options and plan of care with pt, daughters, RN and Dr. Yisroel Ramming. and  Dr Elsworth Soho.I have personally examined this patient, reviewed notes, independently viewed imaging studies, participated in medical decision making and plan of care.ROS completed by me personally and pertinent positives fully documented  I have made any additions or clarifications directly to the above note. Agree with note above. Marland Kitchen He presented with subacute right hemispheric infarcts couple of days prior to presentation and had neurological worsening following cardiac catheterization with MRI showing bi-cerebral embolic infarcts.  The patient's prognosis appears to be quite were given his multiple medical problems. Family to decide on level of care agree with DO NOT RESUSCITATE but continuing medical care for now. This patient is critically ill and at significant risk of neurological worsening, death and care requires constant monitoring of vital signs, hemodynamics,respiratory and cardiac monitoring, extensive review of multiple databases, frequent neurological assessment, discussion with family, other specialists and medical decision making of high complexity.I have made any additions or clarifications directly to the above note.This critical care time does not reflect procedure time, or teaching time or supervisory time of PA/NP/Med Resident etc but could involve care discussion time.  I spent 30 minutes of neurocritical care time  in the care of  this patient.      Antony Contras, MD Medical Director [facility] Pager: [phone] 02/11/2017 2:13 PM   Hospital day # 5     To contact Stroke Continuity provider, please refer to [email or web address] After hours, contact General Neurology

## 2017-02-11 NOTE — Progress Notes (Signed)
[place] Progress Note Patient Name: Charles Hanson. DOB: 04-19-1941 MRN: [id number]   Date of Service  02/11/2017  HPI/Events of Note  Patient has had several 10 second runs of SVT overnight and now has had a 10 minutes run of SVT. Now appears to be back in NSR with PAC's. Last K+ = 4.3 and last Mg++ = 2.3.  eICU Interventions  Will order: 1. Cardizem IV infusion. Titrate to HR =  65-105. 2. D/C Amlodipine PO 3. 12 Lead EKG now.      Intervention Category Major Interventions: Arrhythmia - evaluation and management  Abbi Mancini Cornelia Copa 02/11/2017, 5:34 AM

## 2017-02-11 NOTE — Progress Notes (Signed)
  Speech Language Pathology Treatment: Dysphagia  Patient Details Name: Charles Hanson. MRN: [id number] DOB: 1941-02-17 Today's Date: 02/11/2017 Time: 1202-1214 SLP Time Calculation (min) (ACUTE ONLY): 12 min  Assessment / Plan / Recommendation Clinical Impression  Pt remains lethargic with minimal eyelid opening during stimulation. He did show minimal oral movements while SLP performed oral care, but not when applying spoon or ice chips to lips. No POs were provided given current mentation and reduced responsiveness. Would continue NPO status. Family was at bedside today and was educated about recommendations and current status.   HPI HPI: Pt is a 76 y.o. male presenting with generalized weakness, chest pain, and dysphagia. Positive for NSTEMI. Pt had questionable left-sided weakness on 4/17, with deterioration in neurological status that night including increased lethargy, left gaze, right neglect, facial twitching. MRI showed watershed infarcts.PMH is significant for HTN, HLD, CKDIII, NIDDM, EtOH abuse      SLP Plan  Continue with current plan of care       Recommendations  Diet recommendations: NPO Medication Administration: Via alternative means                Oral Care Recommendations: Oral care QID Follow up Recommendations: Other (comment) (tba) SLP Visit Diagnosis: Dysphagia, unspecified (R13.10) Plan: Continue with current plan of care       GO                [name] 02/11/2017, 1:15 PM  [name], M.A. CCC-SLP [phone]

## 2017-02-11 NOTE — Evaluation (Signed)
Physical Therapy Evaluation Patient Details Name: Charles Hanson. MRN: [id number] DOB: 25-Jan-1941 Today's Date: 02/11/2017   History of Present Illness  Patient is a 76 yo male admitted 02/06/17 with generalized weakness and chest pain. Positive for NSTEMI.  PMH is significant for HTN, HLD, CKDIII, NIDDM.  Pt develooped Lt hand weakness 02/08/17 then progressive weakness with decreased arousal 02/09/17 as well as episodes of intermittent A-Fib, SVT, V-tach with elevated troponins with inteferior T wave changes on ECG.  MRI of brain 02/08/17 showed Numerous small acute infarcts in the bilateral cerebellum and along the bilateral cerebral convexity, a watershed/embolic pattern.  Clinical Impression  Patient with a significant decrease in neuro status since last PT evaluation on 02/08/17.  Patient is minimally responsive, withdrawing to pain in all 4 extremities, and opens eyes partially in response to maximal stimulation (however eyes do not fixate).  Patient requiring +2 total assist for all mobility, and is not following commands.  Recommend SNF at discharge (rather than [facility]) for continued therapy as appropriate.    Follow Up Recommendations SNF;Supervision/Assistance - 24 hour    Equipment Recommendations  Wheelchair (measurements PT);Wheelchair cushion (measurements PT);Hospital bed    Recommendations for Other Services       Precautions / Restrictions Precautions Precautions: Fall Restrictions Weight Bearing Restrictions: No      Mobility  Bed Mobility Overal bed mobility: Needs Assistance Bed Mobility: Supine to Sit;Sit to Supine     Supine to sit: Total assist;+2 for physical assistance Sit to supine: Total assist;+2 for physical assistance   General bed mobility comments: Patient unable to assist with transitions, and once upright.  Transfers                 General transfer comment: unable  Ambulation/Gait                Stairs            Wheelchair  Mobility    Modified Rankin (Stroke Patients Only) Modified Rankin (Stroke Patients Only) Pre-Morbid Rankin Score: No significant disability Modified Rankin: Severe disability     Balance Overall balance assessment: Needs assistance Sitting-balance support: Feet supported;Single extremity supported Sitting balance-Leahy Scale: Zero Sitting balance - Comments: Pt requires total A to sit EOB.  Pt does not attempt to initiate righting reactions or sit upright - flaccid trunk  Postural control: Posterior lean                                   Pertinent Vitals/Pain Pain Assessment: Faces Faces Pain Scale: No hurt    Home Living Family/patient expects to be discharged to:: Private residence Living Arrangements: Spouse/significant other Available Help at Discharge: Family;Available PRN/intermittently Type of Home: House Home Access: Stairs to enter Entrance Stairs-Rails: Right Entrance Stairs-Number of Steps: 4 Home Layout: One level Home Equipment: Grab bars - tub/shower      Prior Function Level of Independence: Independent         Comments: 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     Hand Dominance   Dominant Hand: Right    Extremity/Trunk Assessment   Upper Extremity Assessment Upper Extremity Assessment: Defer to OT evaluation    Lower Extremity Assessment Lower Extremity Assessment: RLE  deficits/detail;LLE deficits/detail RLE Deficits / Details: No spontaneous movement noted.  RLE Coordination: decreased gross motor LLE Deficits / Details: No spontaneous movement noted.  LLE Coordination: decreased gross motor    Cervical / Trunk Assessment Cervical / Trunk Assessment: Other exceptions Cervical / Trunk Exceptions: no active trunk, head/neck activation    Communication   Communication: Receptive difficulties;Expressive difficulties (does not respond)  Cognition Arousal/Alertness: Lethargic Behavior During Therapy: Flat affect Overall Cognitive Status: Impaired/Different from baseline                                 General Comments: Pt withdraws to pain all 4 extrems.  Opens eyes partially, but eyes rolled upwardly with no visual fixation noted.  Follows no commands.       General Comments      Exercises     Assessment/Plan    PT Assessment Patient needs continued PT services  PT Problem List Decreased strength;Decreased activity tolerance;Decreased balance;Decreased mobility;Decreased coordination;Decreased cognition;Decreased knowledge of use of DME;Decreased safety awareness;Cardiopulmonary status limiting activity       PT Treatment Interventions DME instruction;Functional mobility training;Therapeutic activities;Therapeutic exercise;Balance training;Neuromuscular re-education;Cognitive remediation;Patient/family education;Gait training    PT Goals (Current goals can be found in the Care Plan section)  Acute Rehab PT Goals Patient Stated Goal: Family hoping for improvement. PT Goal Formulation: With family Time For Goal Achievement: 02/25/17 Potential to Achieve Goals: Fair    Frequency Min 2X/week   Barriers to discharge Decreased caregiver support PRN assist.    Co-evaluation PT/OT/SLP Co-Evaluation/Treatment: Yes Reason for Co-Treatment: Complexity of the patient's impairments (multi-system involvement);For patient/therapist safety;Necessary to address cognition/behavior during functional activity PT goals addressed during session: Mobility/safety with mobility;Balance         End of Session Equipment Utilized During Treatment: Oxygen Activity Tolerance: Patient limited by lethargy (Decreased level of arousal) Patient left: in bed;with call bell/phone within reach;with family/visitor present    PT Visit Diagnosis: Other abnormalities of gait and mobility (R26.89);Muscle weakness (generalized) (M62.81);Other symptoms and signs involving the nervous system (R29.898)    Time: 6834-1962 PT Time Calculation (min) (ACUTE ONLY): 18 min   Charges:   PT Evaluation $PT Re-evaluation: 1 Procedure     PT G Codes:        Carita Pian. Sanjuana Kava, [facility] Acute Rehab Services Pager [phone]   Despina Pole 02/11/2017, 8:01 PM

## 2017-02-11 NOTE — Progress Notes (Signed)
PULMONARY / CRITICAL CARE MEDICINE   Name: Charles Hanson. MRN: [id number] DOB: 1940/12/06    ADMISSION DATE:  02/06/2017 CONSULTATION DATE:  02/09/2017  REFERRING MD:  Dr. Andria Frames  CHIEF COMPLAINT:  Altered mental status  HISTORY OF PRESENT ILLNESS:  76 year old Male admitted on 4/15 with generalized weakness and chest pain.  Information obtained from medical chart review.  Patient has a medical history of former smoker (quit 6 years ago), HTN, CKD stage 3, current ETOH abuse, HLD, and NIDDM.   He presented with a 10 day history of progressive generalized weakness, fatigue, and atypical chest pain.  Chest pain occurred with swallowing food, described as a burning sensation, and that food would get stuck resulting in him vomiting the food back up.  This has been progressive over the last 3 months with family estimating a 20 lb weight loss  His fatigue was accompanied with a decrease in activity due to the pain in his feet in which he received a steroid injection by a podiatrist 3 days prior to admit. Additionally, he had just started taking bystolic for HTN although his family admits he only takes the medicine his doctor gives him for free.    On admit, he was found to be bradycardic and hypertensive with positive troponin's and EKG changes c/w NSTEMI with inferior lead involvement.  He was placed on heparin gtt.  Cardiology consulted and he was taken for a LHC on 4/16.  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  GI also consulted for evaluation of his dysphasia.    On 4/17, the patient was found to have new onset left hand weakness.  A code stroke was called and MRI obtained. NIHSS was 0.  It was some debate over if this left hand weakness was new or ongoing since 4/15  according to the family. TPA was deferred given the uncertainty of LWN time.  MRI revealed innumerable  multifocal areas of ischemic infarction consistent with watershed embolic stroke.    Overnight on 4/17, his neurological status continued to deteriorate which was somewhat obscured by the ativan he was receiving per the CIWA protocol.  He was lethargic but following commands but found to have a left gaze, right neglect.  He additionally developed intermittent right facial twitching.  EEG did not indicate seizure activity but exam limited due to excessive motion artifacts; Depacon was started. 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    PCCM called for concern for airway protection and monitoring.   SUBJECTIVE:  Runs of SVT, controlled after Cardizem gtt.  Per RN, patient moving left side spont.  No acute events  VITAL SIGNS: BP 114/66   Pulse 73   Temp 98.2 F (36.8 C) (Axillary)   Resp (!) 21   Ht 5\' 8"  (1.727 m)   Wt 177 lb 14.6 oz (80.7 kg)   SpO2 98%   BMI 27.05 kg/m   HEMODYNAMICS:    VENTILATOR SETTINGS:    INTAKE / OUTPUT: I/O last 3 completed shifts: In: 3189.8 [I.V.:2510.5; NG/GT:309.3; IV Piggyback:370] Out: 527 [Urine:735]  PHYSICAL EXAMINATION: General:  Elderly male lying in bed in NAD HEENT: MM pink/moist, cortrak to right nare Neuro: Obtunded, moans to pain and withdrawals in all extremities, does not follow commands, pupils 3/= reactive,  CV: RR, SR 80's, no m/r/g PULM: mildly  tachypneic, WOB improved from yesterday, less labored, LLL crackles GI: round, soft, bs + Extremities: warmer, faint bilateral DPs, no edema Skin: no rashes or lesions, dressing to left elbow  LABS:  BMET  Recent Labs Lab 02/08/17 0350 02/09/17 0445 02/10/17 0131  NA 133* 134* 137  K 4.5 3.8 4.3  CL 103 105 107  CO2 23 22 22   BUN 30* 21* 21*  CREATININE 1.58* 1.51* 1.63*  GLUCOSE 107* 207* 129*    Electrolytes  Recent Labs Lab 02/08/17 0350 02/09/17 0445 02/10/17 0131  CALCIUM 8.3* 8.3* 8.3*  MG  --  1.4*  2.3    CBC  Recent Labs Lab 02/09/17 0445 02/10/17 0131 02/11/17 0245  WBC 15.9* 21.0* 19.7*  HGB 10.0* 10.8* 10.2*  HCT 29.3* 32.5* 30.4*  PLT 162 183 195    Coag's  Recent Labs Lab 02/07/17 0618  INR 1.11    Sepsis Markers  Recent Labs Lab 02/10/17 1000 02/11/17 0245  PROCALCITON 0.68 1.91    ABG No results for input(s): PHART, PCO2ART, PO2ART in the last 168 hours.  Liver Enzymes  Recent Labs Lab 02/06/17 1330  AST 35  ALT 29  ALKPHOS 128*  BILITOT 1.0  ALBUMIN 2.7*    Cardiac Enzymes  Recent Labs Lab 02/09/17 1916 02/10/17 0131 02/10/17 0729  TROPONINI 9.32* 9.84* 7.78*    Glucose  Recent Labs Lab 02/10/17 1235 02/10/17 1611 02/10/17 1920 02/11/17 0010 02/11/17 0401 02/11/17 0741  GLUCAP 117* 151* 168* 208* 198* 210*    Imaging Dg Chest Port 1 View  Result Date: 02/10/2017 CLINICAL DATA:  Shortness of Breath EXAM: PORTABLE CHEST 1 VIEW COMPARISON:  02/06/2017 FINDINGS: Borderline cardiomegaly. There is central vascular congestion, bilateral interstitial prominence and hazy central airspace opacification right greater than left. Findings highly suspicious for bilateral pulmonary edema rather than bilateral pneumonia. Question small left pleural effusion. IMPRESSION: There is central vascular congestion, bilateral interstitial prominence and hazy central airspace opacification right greater than left. Findings highly suspicious for bilateral pulmonary edema rather than bilateral pneumonia. Question small left pleural effusion. Electronically Signed   By: Lahoma Crocker M.D.   On: 02/10/2017 09:44   Dg Abd Portable 1v  Result Date: 02/10/2017 CLINICAL DATA:  ET tube placement EXAM: PORTABLE ABDOMEN - 1 VIEW COMPARISON:  None in PACs FINDINGS: The radiodense tipped feeding tube projects in the region of the third portion of the duodenum. There are loops of mildly distended gas-filled small bowel in the midline. There is a small amount of gas  in the stomach and colon. IMPRESSION: The feeding tube tip projects in the region of the third portion of the duodenum. Electronically Signed   By: David  Martinique M.D.   On: 02/10/2017 15:14     STUDIES:  LHC 4/16 >> mild/mod 3 vessel disease, one concerning LAD culprit, no PCI -> medical managed. MRI/MRA Head 4/17 >> numerous infarcts in bilateral cerebellum and bilateral cerebral convexity c/w watershed/embolic pattern; no acute finding or flow limiting stenosis on intracranial MRA  TTE 4/18 >> normal LV size and EF 60-65%; no RWA, G2DD, mild calcification of AV annulus with mild thickening and calcification of the leafleats Head CT 4/18 am >> no changes  EEG 4/18 >> no epileptic activity  Head CT 4/18 pm >> areas of low density scattered within both cerebral hemispheres, most notable in right frontoparietal vertex; no hemorrhage or mass effect BLE Arterial Duplex 4/19 >> Right ABI 0.59, left ABI unable to accurately assess, suggest mild to moderate  disease  CULTURES:   ANTIBIOTICS: Zosyn 4/19 >>  SIGNIFICANT EVENTS: 4/15 >> Admit w/Generalized weakness, dysphagia, NSTEMI 4/17 >> Code stroke called 4/18 >> PCCM consulted for airway concerns and worsening neuro status 4/19 >> Declining neuro status; DNR/ DNI  LINES/TUBES: Foley 4/18 >> R nare cortrak post pyloric 4/19 >>  DISCUSSION:  66 yoM with history of ETOH abuse admited on 4/15 with generalized weakness, dysphagia, and NSTEMI.  [facility]16 found to have 3 vessel disease and was managed medically.  On 4/17, he started to have left sided focal deficits, right facial twitching, and progressive decline in neuro exam.  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  Source of emboli unclear.  Moved to the ICU for further airway and neuro monitoring.   ASSESSMENT / PLAN:  PULMONARY A: Acute respiratory failure - in the setting of progressive neuro decline/aspiration Aspiration pneumonia  Hx former tobacco abuse P:   Titrate O2 as  needed to keep sats >94% Trend CXR Continue unasyn   CARDIOVASCULAR A:  NSTEMI - originally peaked on 4/16 CAD- LHC on 4/16 - EF 60-65% SVT w/ PAC vs PAF - new onset 4/17  Hx HTN, HLD P:  Continue monitoring hemodynamics Cardiology following Cardizem per cards Apresoline prn SBP > 220 Metoprolol, Norvasc, ASA, lipitor, imdur per Cards   RENAL A:   Hypomagnesium Hx CKD3 P:   Trend mag Trend BMP / urinary output Replace electrolytes as indicated Avoid nephrotoxic agents, ensure adequate renal perfusion   GASTROINTESTINAL A:   Dysphagia-  P:   TF per nutrition-    HEMATOLOGIC A:   Anemia  Mild thrombocytopenia P:  Trend CBC Lovenox for DVT prophylaxis   INFECTIOUS A:   Leukocytosis -   P:   Trend fever/WBC curve Abx as above   ENDOCRINE A:   Hx NIDDM (HA1c 4.9 on 02/06/17) P:   Change to moderate SSI  NEUROLOGIC A:   Acute encephalopathy -  Acute CVA watershed/ embolic - source unclear with progressive decline in neuro exams ETOH abuse-  Facial twitching- r/o seizure, resolved  P:   Neurology following Continue MVI, thiamine and folate Continue Depacon CIWA protocol- ativan  EXTREMITIES A:  R/o BLE arterial embolism- new mottling of BLE with diminished pulses 4/19 P:  Arterial duplex neg for clot   FAMILY  - Updates: No family at the bedside.  Family meeting held on 4/19 with his three children.  Code status changed to DNR and will continue current medical therapies for now.   - Inter-disciplinary family meet or Palliative Care meeting due by:  02/16/2017  PCCM will sign off and be available as needed.    Kennieth Rad, AGACNP-BC [facility] Pgr: [phone] or if no answer [phone] 02/11/2017, 8:08 AM

## 2017-02-11 NOTE — Progress Notes (Signed)
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   Monitor magnesium and phosphorus every 12 hours for 3 occurances, MD to replete as needed, as pt is at risk for refeeding syndrome given malnutrition.   NUTRITION DIAGNOSIS:   Malnutrition related to chronic illness (DM, CKD, HTN) as evidenced by mild depletion of muscle mass, mild depletion of body fat, percent weight loss (12% Weight loss within 6 months). Ongoing.   GOAL:   Patient will meet greater than or equal to 90% of their needs Progressing.   MONITOR:   PO intake, Supplement acceptance, Labs, I & O's  ASSESSMENT:   76 y.o. male who has a 77 yr h/o DM, CKD stage 3, recent difficulty with HTN control who was admitted with somewhat atypical chest pain and also has had difficulty with swallowing food.   Spoke with CCM, ok to advance TF today Plan for continued care but is now DNR.  Medications reviewed and include: MVI, thiamine, folic acid 8/56 cortrak placed, tip in 3rd portion of duodenum   Diet Order:  Diet NPO time specified  Skin:  Reviewed, no issues  Last BM:  4/19  Height:   Ht Readings from Last 1 Encounters:  02/09/17 5\' 8"  (1.727 m)    Weight:   Wt Readings from Last 1 Encounters:  02/11/17 177 lb 14.6 oz (80.7 kg)    Ideal Body Weight:  70 kg  BMI:  Body mass index is 27.05 kg/m.  Estimated Nutritional Needs:   Kcal:  3149-7026  Protein:  85-95 gm  Fluid:  1.8 L  EDUCATION NEEDS:   No education needs identified at this time  [place], [place], [place] Pager [phone] After Hours Pager

## 2017-02-11 NOTE — Progress Notes (Signed)
ANTICOAGULATION CONSULT NOTE - Follow Up Consult  Pharmacy Consult for Heparin Indication: atrial fibrillation, stroke and NSTEMI  No Known Allergies  Patient Measurements: Height: 5\' 8"  (172.7 cm) Weight: 177 lb 14.6 oz (80.7 kg) IBW/kg (Calculated) : 68.4 Heparin Dosing Weight: 80.7 kg  Vital Signs: Temp: 98 F (36.7 C) (04/20 1519) Temp Source: Axillary (04/20 1519) BP: 119/71 (04/20 1500) Pulse Rate: 74 (04/20 1500)  Labs:  Recent Labs  02/09/17 0445  02/09/17 1916 02/10/17 0131 02/10/17 0729 02/11/17 0245  HGB 10.0*  --   --  10.8*  --  10.2*  HCT 29.3*  --   --  32.5*  --  30.4*  PLT 162  --   --  183  --  195  CREATININE 1.51*  --   --  1.63*  --   --   TROPONINI  --   < > 9.32* 9.84* 7.78*  --   < > = values in this interval not displayed.  Estimated Creatinine Clearance: 36.7 mL/min (A) (by C-G formula based on SCr of 1.63 mg/dL (H)).  Assessment:   76 yr old male to resume IV heparin for afib and NSTEMI.  Previously on IV heparin 4/15 - 4/16 with heparin level 0.32 on 1150 units/hr. Heparin dc'd post-cath 4/16.  Heparin resumed 4/18 for several hours, then discontinued with new stroke and risk of hemorrhagic transformation and no definite documented evidence of afib.  Now with documented afib and to resume IV heparin. Spoke briefly with Dr. Claiborne Billings, who discussed with Dr. Leonie Man. Will use low heparin level goal and plan no heparin boluses.  Goal of Therapy:  Heparin level 0.3-0.5 units/ml Monitor platelets by anticoagulation protocol: Yes   Plan:   Resume heparin drip at 1150 units/hr.  No boluses.  Heparin level ~8 hrs after drip begins.  Daily heparin level and CBC.  Monitor for any bleeding.  Arty Baumgartner, [place] Pager: [phone] 02/11/2017,4:52 PM

## 2017-02-12 ENCOUNTER — Inpatient Hospital Stay (HOSPITAL_COMMUNITY): Payer: Medicare Other

## 2017-02-12 DIAGNOSIS — J690 Pneumonitis due to inhalation of food and vomit: Secondary | ICD-10-CM

## 2017-02-12 DIAGNOSIS — N179 Acute kidney failure, unspecified: Secondary | ICD-10-CM

## 2017-02-12 DIAGNOSIS — F1020 Alcohol dependence, uncomplicated: Secondary | ICD-10-CM

## 2017-02-12 DIAGNOSIS — G40209 Localization-related (focal) (partial) symptomatic epilepsy and epileptic syndromes with complex partial seizures, not intractable, without status epilepticus: Secondary | ICD-10-CM

## 2017-02-12 DIAGNOSIS — I25110 Atherosclerotic heart disease of native coronary artery with unstable angina pectoris: Secondary | ICD-10-CM

## 2017-02-12 DIAGNOSIS — I480 Paroxysmal atrial fibrillation: Secondary | ICD-10-CM

## 2017-02-12 DIAGNOSIS — I4891 Unspecified atrial fibrillation: Secondary | ICD-10-CM

## 2017-02-12 DIAGNOSIS — R569 Unspecified convulsions: Secondary | ICD-10-CM

## 2017-02-12 DIAGNOSIS — R401 Stupor: Secondary | ICD-10-CM

## 2017-02-12 LAB — GLUCOSE, CAPILLARY
GLUCOSE-CAPILLARY: 224 mg/dL — AB (ref 65–99)
GLUCOSE-CAPILLARY: 169 mg/dL — AB (ref 65–99)
GLUCOSE-CAPILLARY: 192 mg/dL — AB (ref 65–99)
GLUCOSE-CAPILLARY: 218 mg/dL — AB (ref 65–99)
Glucose-Capillary: 174 mg/dL — ABNORMAL HIGH (ref 65–99)
Glucose-Capillary: 207 mg/dL — ABNORMAL HIGH (ref 65–99)

## 2017-02-12 LAB — HEPARIN LEVEL (UNFRACTIONATED)
HEPARIN UNFRACTIONATED: 0.26 [IU]/mL — AB (ref 0.30–0.70)
HEPARIN UNFRACTIONATED: 0.53 [IU]/mL (ref 0.30–0.70)
Heparin Unfractionated: 0.49 IU/mL (ref 0.30–0.70)

## 2017-02-12 LAB — BASIC METABOLIC PANEL
ANION GAP: 12 (ref 5–15)
BUN: 52 mg/dL — ABNORMAL HIGH (ref 6–20)
CO2: 20 mmol/L — ABNORMAL LOW (ref 22–32)
Calcium: 8.2 mg/dL — ABNORMAL LOW (ref 8.9–10.3)
Chloride: 108 mmol/L (ref 101–111)
Creatinine, Ser: 2.27 mg/dL — ABNORMAL HIGH (ref 0.61–1.24)
GFR calc Af Amer: 30 mL/min — ABNORMAL LOW (ref >60)
GFR, EST NON AFRICAN AMERICAN: 26 mL/min — AB (ref >60)
Glucose, Bld: 203 mg/dL — ABNORMAL HIGH (ref 65–99)
POTASSIUM: 3.9 mmol/L (ref 3.5–5.1)
SODIUM: 140 mmol/L (ref 135–145)

## 2017-02-12 LAB — CBC
HEMATOCRIT: 31.7 % — AB (ref 39.0–52.0)
HEMOGLOBIN: 11 g/dL — AB (ref 13.0–17.0)
MCH: 32 pg (ref 26.0–34.0)
MCHC: 34.7 g/dL (ref 30.0–36.0)
MCV: 92.2 fL (ref 78.0–100.0)
Platelets: 192 10*3/uL (ref 150–400)
RBC: 3.44 MIL/uL — AB (ref 4.22–5.81)
RDW: 13.4 % (ref 11.5–15.5)
WBC: 18.8 10*3/uL — ABNORMAL HIGH (ref 4.0–10.5)

## 2017-02-12 LAB — PHOSPHORUS
PHOSPHORUS: 4.7 mg/dL — AB (ref 2.5–4.6)
Phosphorus: 3.4 mg/dL (ref 2.5–4.6)

## 2017-02-12 LAB — MAGNESIUM
MAGNESIUM: 2.1 mg/dL (ref 1.7–2.4)
MAGNESIUM: 2.3 mg/dL (ref 1.7–2.4)

## 2017-02-12 LAB — VALPROIC ACID LEVEL: Valproic Acid Lvl: 66 ug/mL (ref 50.0–100.0)

## 2017-02-12 LAB — PROCALCITONIN: PROCALCITONIN: 2.14 ng/mL

## 2017-02-12 MED ORDER — SODIUM CHLORIDE 0.9 % IV SOLN
3.0000 g | Freq: Two times a day (BID) | INTRAVENOUS | Status: DC
  Administered 2017-02-12 – 2017-02-13 (×2): 3 g via INTRAVENOUS
  Filled 2017-02-12 (×4): qty 3

## 2017-02-12 NOTE — Progress Notes (Signed)
Pharmacy Antibiotic Note  Charles Hanson. is a 76 y.o. male admitted on 02/06/2017 with pneumonia.  Pharmacy has been consulted for Unasyn dosing. Scr is worsening to 2.27 and procalcitonin is up. Pt is afebrile but WBC is elevated at 18.8.   Plan: Change unasyn to 3gm IV Q12H F/u renal fxn, C&S, clinical status and LOT  Height: 5\' 8"  (172.7 cm) Weight: 180 lb 1.9 oz (81.7 kg) IBW/kg (Calculated) : 68.4  Temp (24hrs), Avg:98.3 F (36.8 C), Min:97.5 F (36.4 C), Max:98.8 F (37.1 C)   Recent Labs Lab 02/07/17 0618 02/08/17 0350 02/09/17 0445 02/10/17 0131 02/11/17 0245 02/12/17 0540  WBC 12.6* 12.5* 15.9* 21.0* 19.7* 18.8*  CREATININE 1.72* 1.58* 1.51* 1.63*  --  2.27*    Estimated Creatinine Clearance: 26.4 mL/min (A) (by C-G formula based on SCr of 2.27 mg/dL (H)).    No Known Allergies  Thank you for allowing pharmacy to be a part of this patient's care.  Yitzchok Carriger, Rande Lawman 02/12/2017 10:58 AM

## 2017-02-12 NOTE — Progress Notes (Signed)
Family Medicine Teaching Service Daily Progress Note Intern Pager: [phone]  Patient name: Charles Hanson. Medical record number: [id number] Date of birth: 1941-02-17 Age: 76 y.o. Gender: male  Primary Care Provider: [facility] Consultants: CCM, neurology, cardiology  Code Status: DNR  Pt Overview and Major Events to Date:  04/15: Admit for NSTEMI, heparin gtt initiated, minor AKI 04/16: LHC mild-mod dz, proceed with medical management, consider Myoview if symptoms persist 04/17: GI consulted for dysphagia, new onset left hand grip weakness, code stroke called 04/18: Worsening weakness, cannot see, left lateral deviation, troponin elevation, transferred to ICU 4/20 Cardizem drip started  Assessment and Plan: Charles Hansonis a 76 y.o.malepresenting with generalized weakness and chest pain. PMH is significant for HTN, HLD, CKDIII, NIDDM.  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 Dx by MRI brain. 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 EEG unremarkable. --Neuro consulted, appreciate recommendations: repeat MRI brain today, EEG pending - Depacon 500mg  q8 - ASA --PT/OT: SNF - palliative care consulted, appreciate assistance  - family is considering comfort care   Acute Respiratory Failure I Bilateral Lung Infiltrates I Aspiration PNA: Unasyn started in ICU - Unasyn day #4  # Inferior-NSTEMI  SVT I New Afib:  --Cardiology consulted, appreciate recommendations:  cath 4/16/18showed p/mLAD lesion that was >85mm in length. medical management.  - Cardizem drip - IV heparin - IV lopressor 5mg  q 4 hr  --ASA, lipitor, imdur per Cards --Monitor BP and HR   #Dysphasia: Subacute. Daughter noted 20 pound weight loss over 4 months. Patient is endorsing difficulty swallowing foods and liquids for the past several months. Patient failed SLP evaluation with fluids and liquids. Cortrak placed while in the  ICU --GI resulted, appreciate recommendations: unable to see patient on 4/18 - tube feeds  #Hypertension  Hyperlipidemia:Chronic.  - Imdur --Lipitor 80 mg QD --ASA 81 mg QD  #Acute kidney injury  Chronic kidney disease stage III, worsening: Cr 2.27 (from 1.63). Baseline 1.73 per chart review. Increase possibly due to Lasix use.  --stop Lasix --Avoid nephrotoxic agents --Daily BMET  #Non-insulin-dependent diabetes mellitus: A1c 4.9 during admission. -- CBG q 4 hr while on tube feeds  -- mod SSI q 4hr   FEN/GI: NPO with TF.  Prophylaxis: Heparin drip  Disposition:continued management. Palliative Care Consulted.   Subjective:  No family at bedside when I went to see patient. Patient does not arouse to voice or tactile stimuli.  Per nursing his mental status has been the same.   Objective: Temp:  [97.5 F (36.4 C)-98.8 F (37.1 C)] 98.8 F (37.1 C) (04/21 0400) Pulse Rate:  [73-102] 89 (04/21 0700) Resp:  [17-38] 17 (04/21 0700) BP: (97-175)/(60-117) 142/80 (04/21 0700) SpO2:  [94 %-100 %] 97 % (04/21 0700) Weight:  [81.7 kg (180 lb 1.9 oz)] 81.7 kg (180 lb 1.9 oz) (04/21 0434) Physical Exam: GEN: NAD does not respond to voice or tactile stimulation  HEENT: PERRL, no eye movements noted CV: RRR, no murmurs, rubs, or gallops PULM: CTAB, normal effort; NG tube noted ABD: Soft, nondistended, NABS, no organomegaly SKIN: No rash or cyanosis; warm and well-perfused EXTR: No lower extremity edema or calf tenderness PSYCH: Mood and affect euthymic, normal rate and volume of speech NEURO: does not respond to voice or tactile stimulation    Laboratory:  Recent Labs Lab 02/10/17 0131 02/11/17 0245 02/12/17 0540  WBC 21.0* 19.7* 18.8*  HGB 10.8* 10.2* 11.0*  HCT 32.5*  30.4* 31.7*  PLT 183 195 192    Recent Labs Lab 02/06/17 1330  02/09/17 0445 02/10/17 0131 02/12/17 0540  NA  --   < > 134* 137 140  K  --   < > 3.8 4.3 3.9  CL  --   < > 105 107 108  CO2   --   < > 22 22 20*  BUN  --   < > 21* 21* 52*  CREATININE  --   < > 1.51* 1.63* 2.27*  CALCIUM  --   < > 8.3* 8.3* 8.2*  PROT 5.9*  --   --   --   --   BILITOT 1.0  --   --   --   --   ALKPHOS 128*  --   --   --   --   ALT 29  --   --   --   --   AST 35  --   --   --   --   GLUCOSE  --   < > 207* 129* 203*  < > = values in this interval not displayed.   Charles Houseman, MD 02/12/2017, 8:22 AM PGY-2, [place] Intern pager: [phone], text pages welcome

## 2017-02-12 NOTE — Progress Notes (Addendum)
ANTICOAGULATION CONSULT NOTE - Follow Up Consult  Pharmacy Consult for Heparin Indication: atrial fibrillation, stroke and NSTEMI  No Known Allergies  Patient Measurements: Height: 5\' 8"  (172.7 cm) Weight: 180 lb 1.9 oz (81.7 kg) IBW/kg (Calculated) : 68.4 Heparin Dosing Weight: 80.7 kg  Vital Signs: Temp: 98.4 F (36.9 C) (04/21 1200) Temp Source: Oral (04/21 1200) BP: 142/78 (04/21 0800) Pulse Rate: 92 (04/21 0800)  Labs:  Recent Labs  02/10/17 0131 02/10/17 0729 02/11/17 0245 02/11/17 1629 02/11/17 2201 02/12/17 0237 02/12/17 0540 02/12/17 1144  HGB 10.8*  --  10.2*  --   --   --  11.0*  --   HCT 32.5*  --  30.4*  --   --   --  31.7*  --   PLT 183  --  195  --   --   --  192  --   HEPARINUNFRC  --   --   --   --   --  0.26*  --  0.49  CREATININE 1.63*  --   --   --   --   --  2.27*  --   TROPONINI 9.84* 7.78*  --  8.82* 7.71*  --   --   --     Estimated Creatinine Clearance: 26.4 mL/min (A) (by C-G formula based on SCr of 2.27 mg/dL (H)).  Assessment: 76 yr old male continues on IV heparin for afib and NSTEMI. Heparin level is now therapeutic at 0.49. No bleeding noted. CBC is stable.  Goal of Therapy:  Heparin level 0.3-0.5 units/ml Monitor platelets by anticoagulation protocol: Yes   Plan:  Continue heparin gtt 1250 units/hr Check a 6 hr heparin level to confirm dosing Daily heparin level and CBC  Salome Arnt, PharmD, BCPS Pager # [phone] 02/12/2017 12:44 PM  Addendum: Repeat heparin level slightly above goal at 0.53. No bleeding noted. Will reduce heparin gtt rate slightly to 1200 units/hr and follow-up AM heparin level.  Salome Arnt, PharmD, BCPS Pager # [phone] 02/12/2017 7:46 PM

## 2017-02-12 NOTE — Progress Notes (Signed)
ANTICOAGULATION CONSULT NOTE - Follow Up Consult  Pharmacy Consult for heparin Indication: atrial fibrillation, stroke and NSTEMI  Labs:  Recent Labs  02/09/17 0445  02/10/17 0131 02/10/17 0729 02/11/17 0245 02/11/17 1629 02/11/17 2201 02/12/17 0237  HGB 10.0*  --  10.8*  --  10.2*  --   --   --   HCT 29.3*  --  32.5*  --  30.4*  --   --   --   PLT 162  --  183  --  195  --   --   --   HEPARINUNFRC  --   --   --   --   --   --   --  0.26*  CREATININE 1.51*  --  1.63*  --   --   --   --   --   TROPONINI  --   < > 9.84* 7.78*  --  8.82* 7.71*  --   < > = values in this interval not displayed.   Assessment: 76yo male subtherapeutic on heparin after resumed.   Goal of Therapy:  Heparin level 0.3-0.5 units/ml   Plan:  Will increase heparin gtt by 1-2 units/kg/hr to 1250 units/hr and check level in [place], PharmD, BCPS  02/12/2017,3:55 AM

## 2017-02-12 NOTE — Progress Notes (Signed)
SLP Cancellation Note  Patient Details Name: Charles Hanson. MRN: [id number] DOB: 02-19-1941   Cancelled treatment:       Reason Eval/Treat Not Completed: Fatigue/lethargy limiting ability to participate (Attempted to arouse without success. Will f/u 4/22. )  Gabriel Rainwater [place], [place] [phone]  [name] 02/12/2017, 8:46 AM

## 2017-02-12 NOTE — Consult Note (Signed)
 Consultation Note Date: 02/12/2017   Patient Name: Charles Hanson.  DOB: [birth year]  MRN: [id number]  Age / Sex: 76 y.o., male  PCP: [facility] Referring Physician: Zenia Resides, MD  Reason for Consultation: Establishing goals of care  HPI/Patient Profile: 76 y.o. male  with past medical history of HTN HLD CKD III admitted on 02/06/2017 with chest pain.   Clinical Assessment and Goals of Care:  Mr. Desa is a 76 year old gentleman with a past medical history significant for hypertension, dyslipidemia, coronary artery disease. Patient's family describes him as a functioning, independent person. 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 He has 2 daughters and 1 son who live locally in [place], patient lives at home with his wife in [place], [place].   Patient was admitted with chest pain, underwent a L heart cath showing 3 vessel mild to moderate heart disease, LAD disease, medical management was advised, patient was noted to have hand weakness, was diagnosed with acute CVA in this hospitalization, MRI shows B cerebellar B cerebral convexity possibly embolic infarcts, newly diagnosed A fib, worsening renal function, unresponsive on tube feeds essentially for the past few days.   A palliative consult has been placed for goals of care discussions. The patient is resting in bed, he is unresponsive, he is on tube feeds, he has rapid shallow coarse breath sounds, he does not arouse to family's voice commands.   I met with the patient's son and 2 daughters in his room this afternoon, I introduced myself and palliative care as follows: Palliative medicine is specialized medical care for people living with serious illness. It focuses on providing relief from the symptoms and stress of a serious illness. The goal is to improve quality of life for both the patient  and the family.  Brief life review performed, patient was on the phone, taking care of his business soon after his heart cath here in the hospital, just a few days ago. We discussed about his current condition, his obtundation, acute extensive CVAs, underlying CAD, AKI, CKD. We discussed about end of life care, goals and wishes, issues pertaining to artifical nutrition and hydration.   The patient's children are just taken aback by the patient's rapid decline, they are well aware of his serious life limiting illness now and his limited prognosis. They state, "he would not want to live like this." they are all tearful and full of anticipatory grief over his suspected ongoing decline and impending demise, also worried about their mother who has advanced Alzheimer's. One of his daughters states that the patient has never been hospitalized a day in his life, he was even born at home. Hence, this is a sudden change for the patient and his family to come to terms with. I offered lots of active listening and supportive care, see recommendations below. Thank you for the consult.   NEXT OF KIN  wife has dementia, has 2 daughters, one son.   SUMMARY OF  RECOMMENDATIONS    Agree with DNR DNI.  Continue tube feeds for now, current mode of care for next 24 hours as per family preference.   Discussed with family about proceeding with residential hospice, full scope of comfort measures in am on  if patient has worsening of his overall condition, they are agreeable. We discussed about choice of hospice agency, family prefers the local hospice of [place], [place]. I have encouraged them to consider transfer to [facility] residential hospice on .   Code Status/Advance Care Planning:  DNR    Symptom Management:    as above   Palliative Prophylaxis:   Delirium Protocol   Psycho-social/Spiritual:   Desire for further Chaplaincy support:no  Additional Recommendations: Education on  Hospice  Prognosis:   < 2 weeks  Discharge Planning: Hospice facility is recommended, see above.       Primary Diagnoses: Present on Admission: . Atypical chest pain   I have reviewed the medical record, interviewed the patient and family, and examined the patient. The following aspects are pertinent.  Past Medical History:  Diagnosis Date  . Chronic kidney disease   . Hypertension    Social History   Social History  . Marital status: Married    Spouse name: N/A  . Number of children: N/A  . Years of education: N/A   Social History Main Topics  . Smoking status: Former Smoker    Packs/day: 2.00    Years: 15.00    Types: Cigarettes, Cigars    Quit date: 09/27/1965  . Smokeless tobacco: Former Systems developer    Types: Chew    Quit date: 09/27/1989  . Alcohol use 1.2 oz/week    2 Shots of liquor per week  . Drug use: No  . Sexual activity: Not Asked   Other Topics Concern  . None   Social History Narrative  . None   Family History  Problem Relation Age of Onset  . Cancer - Lung Mother   . Kidney disease Father   . Heart failure Father    Scheduled Meds: . aspirin EC  81 mg Oral Daily  . atorvastatin  80 mg Oral q1800  . dorzolamide  1 drop Both Eyes BID  . feeding supplement (PRO-STAT SUGAR FREE 64)  30 mL Per Tube Daily  . folic acid  1 mg Intravenous Daily  . insulin aspart  0-15 Units Subcutaneous Q4H  . isosorbide mononitrate  30 mg Oral QHS  . latanoprost  1 drop Both Eyes QHS  . metoprolol  5 mg Intravenous Q4H  . multivitamin with minerals  1 tablet Oral Daily  . sodium chloride flush  3 mL Intravenous Q12H  . thiamine  100 mg Oral Daily   Or  . thiamine  100 mg Intravenous Daily   Continuous Infusions: . sodium chloride    . ampicillin-sulbactam (UNASYN) IV    . diltiazem (CARDIZEM) infusion 5 mg/hr (02/12/17 1046)  . feeding supplement (JEVITY 1.2 CAL) 1,000 mL (02/12/17 1438)  . heparin 1,250 Units/hr (02/12/17 1432)  . valproate sodium Stopped  (02/12/17 1537)   PRN Meds:.sodium chloride, acetaminophen, hydrALAZINE, morphine injection, nitroGLYCERIN, ondansetron (ZOFRAN) IV, sodium chloride flush Medications Prior to Admission:  Prior to Admission medications   Medication Sig Start Date End Date Taking? Authorizing Provider  Cholecalciferol (VITAMIN D3) 2000 units TABS Take 2 tablets by mouth daily.   Yes Historical Provider, MD  dorzolamide (TRUSOPT) 2 % ophthalmic solution Place 1 drop into both eyes 2 (two) times daily.  Yes Historical Provider, MD  furosemide (LASIX) 20 MG tablet Take 20 mg by mouth every other day.    Yes Historical Provider, MD  glimepiride (AMARYL) 2 MG tablet Take 2 mg by mouth daily with breakfast.   Yes Historical Provider, MD  latanoprost (XALATAN) 0.005 % ophthalmic solution Place 1 drop into both eyes at bedtime.   Yes Historical Provider, MD  losartan-hydrochlorothiazide (HYZAAR) 100-25 MG tablet Take 1 tablet by mouth daily.   Yes Historical Provider, MD  meloxicam (MOBIC) 15 MG tablet Take 15 mg by mouth daily.   Yes Historical Provider, MD  nebivolol (BYSTOLIC) 10 MG tablet Take 10 mg by mouth daily.   Yes Historical Provider, MD  OMEGA-3 FATTY ACIDS PO Take 2 capsules by mouth daily.    Yes Historical Provider, MD  predniSONE (DELTASONE) 20 MG tablet Take 20 mg by mouth 2 (two) times daily.   Yes Historical Provider, MD  simvastatin (ZOCOR) 20 MG tablet Take 20 mg by mouth daily.   Yes Historical Provider, MD   No Known Allergies Review of Systems Unresponsive  Physical Exam Elderly gentleman, unresponsive S1 S2 Rapid shallow breath sounds Trace edema NGT in  Vital Signs: BP (!) 109/53   Pulse 76   Temp 98.5 F (36.9 C) (Axillary)   Resp (!) 23   Ht 5' 8"  (1.727 m)   Wt 81.7 kg (180 lb 1.9 oz)   SpO2 98%   BMI 27.39 kg/m  Pain Assessment: CPOT POSS *See Group Information*: S-Acceptable,Sleep, easy to arouse Pain Score: 0-No pain   SpO2: SpO2: 98 % O2 Device:SpO2: 98 % O2 Flow  Rate: .O2 Flow Rate (L/min): 1 L/min  IO: Intake/output summary:  Intake/Output Summary (Last 24 hours) at 02/12/17 1718 Last data filed at 02/12/17 1400  Gross per 24 hour  Intake          1690.83 ml  Output             1300 ml  Net           390.83 ml    LBM: Last BM Date: 02/10/17 Baseline Weight: Weight: 80.7 kg (178 lb) Most recent weight: Weight: 81.7 kg (180 lb 1.9 oz)     Palliative Assessment/Data:   Flowsheet Rows     Most Recent Value  Intake Tab  Referral Department  Hospitalist  Unit at Time of Referral  ICU  Palliative Care Primary Diagnosis  Other (Comment) [CVA CAD AKI CKD ]  Palliative Care Type  New Palliative care  Reason for referral  Clarify Goals of Care, End of Life Care Assistance, Counsel Regarding Hospice  Date first seen by Palliative Care  02/12/17  Clinical Assessment  Palliative Performance Scale Score  20%  Pain Max last 24 hours  4  Pain Min Last 24 hours  3  Dyspnea Max Last 24 Hours  4  Dyspnea Min Last 24 hours  3  Nausea Max Last 24 Hours  0  Nausea Min Last 24 Hours  0  Anxiety Max Last 24 Hours  3  Anxiety Min Last 24 Hours  2  Psychosocial & Spiritual Assessment  Palliative Care Outcomes  Patient/Family meeting held?  Yes  Who was at the meeting?  patient (who could not participate), 2 daughters and one son.   Palliative Care Outcomes  Clarified goals of care      Time In:  1610 Time Out:  1720 Time Total:  70 min  Greater than 50%  of this time  was spent counseling and coordinating care related to the above assessment and plan.  Signed by: Loistine Chance, MD  [phone]  Please contact Palliative Medicine Team phone at [phone] for questions and concerns.  For individual provider: See Shea Evans

## 2017-02-12 NOTE — Progress Notes (Signed)
Progress Note  Patient Name: Charles Hanson. Date of Encounter: 02/12/2017  Primary Cardiologist: Dr. Carlyle Dolly  Subjective   Patient does not actively communicate. Intermittent facial twitches.  Inpatient Medications    Scheduled Meds: . aspirin EC  81 mg Oral Daily  . atorvastatin  80 mg Oral q1800  . dorzolamide  1 drop Both Eyes BID  . feeding supplement (PRO-STAT SUGAR FREE 64)  30 mL Per Tube Daily  . folic acid  1 mg Intravenous Daily  . insulin aspart  0-15 Units Subcutaneous Q4H  . isosorbide mononitrate  30 mg Oral QHS  . latanoprost  1 drop Both Eyes QHS  . metoprolol  5 mg Intravenous Q4H  . multivitamin with minerals  1 tablet Oral Daily  . sodium chloride flush  3 mL Intravenous Q12H  . thiamine  100 mg Oral Daily   Or  . thiamine  100 mg Intravenous Daily  . traZODone  50 mg Oral QHS   Continuous Infusions: . sodium chloride    . ampicillin-sulbactam (UNASYN) IV Stopped (02/12/17 3664)  . diltiazem (CARDIZEM) infusion 5 mg/hr (02/12/17 0700)  . feeding supplement (JEVITY 1.2 CAL) 1,000 mL (02/12/17 0700)  . heparin 1,250 Units/hr (02/12/17 0700)  . valproate sodium Stopped (02/12/17 0655)   PRN Meds: sodium chloride, acetaminophen, hydrALAZINE, morphine injection, nitroGLYCERIN, ondansetron (ZOFRAN) IV, sodium chloride flush   Vital Signs    Vitals:   02/12/17 0434 02/12/17 0500 02/12/17 0600 02/12/17 0700  BP:  (!) 140/92 115/60 (!) 142/80  Pulse:  87 73 89  Resp:  (!) 24 (!) 22 17  Temp:      TempSrc:      SpO2:  97% 97% 97%  Weight: 180 lb 1.9 oz (81.7 kg)     Height:        Intake/Output Summary (Last 24 hours) at 02/12/17 0853 Last data filed at 02/12/17 0800  Gross per 24 hour  Intake          1755.58 ml  Output             1075 ml  Net           680.58 ml   Filed Weights   02/09/17 1400 02/11/17 0500 02/12/17 0434  Weight: 172 lb 13.5 oz (78.4 kg) 177 lb 14.6 oz (80.7 kg) 180 lb 1.9 oz (81.7 kg)    Telemetry      Currently sinus rhythm. Personally reviewed.  ECG    Tracing from 02/12/2017 shows sinus rhythm was mildly short PR interval, anterolateral ST-T wave abnormalities and prolonged QTc suggestive of ischemic change. Personally reviewed.  Physical Exam   GEN: Elderly male, no acute distress. Feeding tube in place. Neck: No JVD. Cardiac: RRR, no gallop.  Respiratory: Nonlabored. Decreased breath sounds but clear to auscultation bilaterally. GI: Soft, nontender, bowel sounds present. MS: No edema; No deformity.  Labs    Chemistry Recent Labs Lab 02/06/17 1330  02/09/17 0445 02/10/17 0131 02/12/17 0540  NA  --   < > 134* 137 140  K  --   < > 3.8 4.3 3.9  CL  --   < > 105 107 108  CO2  --   < > 22 22 20*  GLUCOSE  --   < > 207* 129* 203*  BUN  --   < > 21* 21* 52*  CREATININE  --   < > 1.51* 1.63* 2.27*  CALCIUM  --   < > 8.3*  8.3* 8.2*  PROT 5.9*  --   --   --   --   ALBUMIN 2.7*  --   --   --   --   AST 35  --   --   --   --   ALT 29  --   --   --   --   ALKPHOS 128*  --   --   --   --   BILITOT 1.0  --   --   --   --   GFRNONAA  --   < > 43* 39* 26*  GFRAA  --   < > 50* 45* 30*  ANIONGAP  --   < > 7 8 12   < > = values in this interval not displayed.   Hematology Recent Labs Lab 02/10/17 0131 02/11/17 0245 02/12/17 0540  WBC 21.0* 19.7* 18.8*  RBC 3.53* 3.33* 3.44*  HGB 10.8* 10.2* 11.0*  HCT 32.5* 30.4* 31.7*  MCV 92.1 91.3 92.2  MCH 30.6 30.6 32.0  MCHC 33.2 33.6 34.7  RDW 12.5 12.8 13.4  PLT 183 195 192    Cardiac Enzymes Recent Labs Lab 02/10/17 0131 02/10/17 0729 02/11/17 1629 02/11/17 2201  TROPONINI 9.84* 7.78* 8.82* 7.71*    Recent Labs Lab 02/06/17 1226  TROPIPOC 0.89*     BNP Recent Labs Lab 02/06/17 1311  BNP 387.7*     Radiology    Dg Chest Port 1 View  Result Date: 02/12/2017 CLINICAL DATA:  Respiratory failure. EXAM: PORTABLE CHEST 1 VIEW COMPARISON:  Single view of the chest 02/10/2017 and PA and lateral chest 02/06/2017.  FINDINGS: A new feeding tube is in place and courses into the stomach and below the inferior margin of film. There has been some improvement in right worse than left airspace disease. No pneumothorax. Heart size is upper normal. IMPRESSION: Right worse than left airspace disease which could be due to edema or pneumonia shows some improvement since yesterday's exam. Electronically Signed   By: Inge Rise M.D.   On: 02/12/2017 08:51   Dg Chest Port 1 View  Result Date: 02/10/2017 CLINICAL DATA:  Shortness of Breath EXAM: PORTABLE CHEST 1 VIEW COMPARISON:  02/06/2017 FINDINGS: Borderline cardiomegaly. There is central vascular congestion, bilateral interstitial prominence and hazy central airspace opacification right greater than left. Findings highly suspicious for bilateral pulmonary edema rather than bilateral pneumonia. Question small left pleural effusion. IMPRESSION: There is central vascular congestion, bilateral interstitial prominence and hazy central airspace opacification right greater than left. Findings highly suspicious for bilateral pulmonary edema rather than bilateral pneumonia. Question small left pleural effusion. Electronically Signed   By: Lahoma Crocker M.D.   On: 02/10/2017 09:44   Dg Abd Portable 1v  Result Date: 02/10/2017 CLINICAL DATA:  ET tube placement EXAM: PORTABLE ABDOMEN - 1 VIEW COMPARISON:  None in PACs FINDINGS: The radiodense tipped feeding tube projects in the region of the third portion of the duodenum. There are loops of mildly distended gas-filled small bowel in the midline. There is a small amount of gas in the stomach and colon. IMPRESSION: The feeding tube tip projects in the region of the third portion of the duodenum. Electronically Signed   By: David  Martinique M.D.   On: 02/10/2017 15:14    Cardiac Studies   Echocardiogram 02/07/2017: Study Conclusions  - Left ventricle: The cavity size was normal. Wall thickness was   normal. Systolic function was normal.  The estimated ejection   fraction was in  the range of 60% to 65%. Wall motion was normal;   there were no regional wall motion abnormalities. Features are   consistent with a pseudonormal left ventricular filling pattern,   with concomitant abnormal relaxation and increased filling   pressure (grade 2 diastolic dysfunction). - Aortic valve: Mildly calcified annulus. Mildly thickened, mildly   calcified leaflets. There was trivial regurgitation. - Pulmonary arteries: Systolic pressure was mildly increased. PA   peak pressure: 34 mm Hg (S).  Cardiac catheterization 02/07/2017:  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  Prox LAD to Mid LAD lesion, 60 %stenosed - pre Diag, Mid LAD lesion, 50 %stenosed - post Diag (total length > 40 mm).  LV end diastolic pressure is mildly elevated.  There is no aortic valve stenosis.  Patient Profile     76 y.o. male with long-standing type 2 diabetes mellitus, CKD stage 3, hypertension, intermittent chest discomfort, and recent diagnosis of stroke. 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 He has had overall worsening neurological status and is currently DO NOT RESUSCITATE. PACs and transient atrial fibrillation with RVR has been noted as well. He is currently on heparin.  Assessment & Plan    1. NSTEMI, Troponin I 8-9 range.  2. 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 LVEF 60-65%.  3. Transient atrial fibrillation with RVR, sinus rhythm present at this time. Currently on IV diltiazem and also divided dose IV Lopressor. Heparin infusion continues as well.  4. Bilateral cerebellar and cerebral infarcts, likely embolic and potentially associated with paroxysmal atrial  fibrillation given documentation of this rhythm transiently on telemetry. He has had declining neurological status, being followed closely by Neurology and currently has a DO NOT RESUSCITATE status with potential plan for comfort measures.  5. CKD, stage 3, creatinine up to 2.3.  6. Essential hypertension, systolic blood pressures 092-330 recently.  Discussed with family members in room. From a cardiac perspective would continue with current medications given rhythm and hemodynamic stability. Overall prognosis is poor as outlined above.  Signed, Rozann Lesches, MD  02/12/2017, 8:53 AM

## 2017-02-12 NOTE — Progress Notes (Signed)
STROKE TEAM PROGRESS NOTE   SUBJECTIVE (INTERVAL HISTORY) His daughter is at the bedside. He still poorly responsive, not open eyes with voice or pain stimulation. On ASA and heparin IV. In NSR. Cre elevated.   OBJECTIVE Temp:  [97.5 F (36.4 C)-98.8 F (37.1 C)] 98.8 F (37.1 C) (04/21 0400) Pulse Rate:  [73-102] 89 (04/21 0700) Cardiac Rhythm: Normal sinus rhythm (04/20 2000) Resp:  [17-38] 17 (04/21 0700) BP: (97-175)/(60-117) 142/80 (04/21 0700) SpO2:  [94 %-100 %] 97 % (04/21 0700) Weight:  [81.7 kg (180 lb 1.9 oz)] 81.7 kg (180 lb 1.9 oz) (04/21 0434)  CBC:   Recent Labs Lab 02/11/17 0245 02/12/17 0540  WBC 19.7* 18.8*  HGB 10.2* 11.0*  HCT 30.4* 31.7*  MCV 91.3 92.2  PLT 195 960    Basic Metabolic Panel:   Recent Labs Lab 02/10/17 0131 02/11/17 1629 02/12/17 0540  NA 137  --  140  K 4.3  --  3.9  CL 107  --  108  CO2 22  --  20*  GLUCOSE 129*  --  203*  BUN 21*  --  52*  CREATININE 1.63*  --  2.27*  CALCIUM 8.3*  --  8.2*  MG 2.3  --  2.1  PHOS  --  2.5 4.7*   Imaging:   Ct Head Wo Contrast  Result Date: 02/09/2017 CLINICAL DATA:  Acute micro embolic infarctions. Worsening of mental status. EXAM: CT HEAD WITHOUT CONTRAST TECHNIQUE: Contiguous axial images were obtained from the base of the skull through the vertex without intravenous contrast. COMPARISON:  CT same day.  MRI yesterday. FINDINGS: Brain: Subtle areas of low-density now visible affecting the cerebral cortex bilaterally, most notable in the right frontoparietal junction region at the vertex. This is consistent with evolutionary change within the areas of acute infarction. There is no evidence of hemorrhage, mass effect or shift. No hydrocephalus or extra-axial collection. Vascular: There is atherosclerotic calcification of the major vessels at the base of the brain. Skull: Negative Sinuses/Orbits: Clear/normal Other: None IMPRESSION: Areas of low-density now visible by CT scattered within both  cerebral hemispheres, most notable at the right frontoparietal vertex. No evidence of hemorrhage or mass effect. Electronically Signed   By: Nelson Chimes M.D.   On: 02/09/2017 18:08   Ct Head Wo Contrast  Result Date: 02/09/2017 CLINICAL DATA:  Altered mental status. History of hypertension. Fatty tissue removed from back of head. EXAM: CT HEAD WITHOUT CONTRAST TECHNIQUE: Contiguous axial images were obtained from the base of the skull through the vertex without intravenous contrast. COMPARISON:  MRI brain 02/08/2017 FINDINGS: Brain: Mild cerebral atrophy. Ventricular dilatation consistent with central atrophy. Patchy low-attenuation changes in the deep white matter consistent small vessel ischemia. The multiple acute punctate infarcts demonstrated on MRI are not visualized at CT. No mass effect or midline shift. No abnormal extra-axial fluid collections. Gray-white matter junctions are distinct. Basal cisterns are not effaced. No acute intracranial hemorrhage. Vascular: No hyperdense vessel or unexpected calcification. Skull: Calvarium appears intact. Sinuses/Orbits: Mild mucosal thickening in the ethmoid air cells and right maxillary antrum. No acute air-fluid levels. Mastoid air cells are not opacified. Other: None. IMPRESSION: No acute intracranial abnormalities demonstrated at CT. Chronic atrophy and small vessel ischemic changes. Electronically Signed   By: Lucienne Capers M.D.   On: 02/09/2017 01:58   Mr Jodene Nam Head Wo Contrast  Result Date: 02/08/2017 CLINICAL DATA:  Left hand weakness.  Symptoms since Sunday. EXAM: MRI HEAD WITHOUT CONTRAST MRA HEAD WITHOUT  CONTRAST TECHNIQUE: Multiplanar, multiecho pulse sequences of the brain and surrounding structures were obtained without intravenous contrast. Angiographic images of the head were obtained using MRA technique without contrast. COMPARISON:  None. FINDINGS: MRI HEAD FINDINGS Brain: Numerous 1 cm or smaller acute infarcts in the bilateral peripheral  cerebellum and along both cerebral convexities, nearly innumerable. Strip of restricted diffusion along the high precentral gyrus on the right, 15 mm in length. Possible tiny left posterolateral medulla infarct. No hemorrhage, hydrocephalus, or mass. Mild background chronic small vessel ischemic change in the periventricular white matter. Vascular: Arterial findings below. Normal dural venous sinus flow voids. Skull and upper cervical spine: No marrow lesion noted. C2-3 facet arthropathy with mild anterolisthesis. Sinuses/Orbits: Bilateral cataract resection. T1 hyperintense material within the left petrous apex which does not appear expanded, likely trapped proteinaceous material. No restricted diffusion in this area. MRA HEAD FINDINGS Symmetric carotid artery size. Right ICA shelf-like appearance at the skullbase is likely artifactual based on source images. Strongly dominant right vertebral artery; the left vertebral artery ends in PICA. No major branch occlusion or reversible flow limiting stenosis. No notable atheromatous irregularity or evidence of inflammatory vasculopathy. Negative for aneurysm. IMPRESSION: 1. Numerous small acute infarcts in the bilateral cerebellum and along the bilateral cerebral convexity, a watershed/embolic pattern. 2. Background age congruent cerebral volume loss and chronic microvascular ischemia. 3. No acute finding or flow limiting stenosis on intracranial MRA. Electronically Signed   By: Monte Fantasia M.D.   On: 02/08/2017 15:59   Mr Brain Wo Contrast  Result Date: 02/08/2017 CLINICAL DATA:  Left hand weakness.  Symptoms since Sunday. EXAM: MRI HEAD WITHOUT CONTRAST MRA HEAD WITHOUT CONTRAST TECHNIQUE: Multiplanar, multiecho pulse sequences of the brain and surrounding structures were obtained without intravenous contrast. Angiographic images of the head were obtained using MRA technique without contrast. COMPARISON:  None. FINDINGS: MRI HEAD FINDINGS Brain: Numerous 1 cm or  smaller acute infarcts in the bilateral peripheral cerebellum and along both cerebral convexities, nearly innumerable. Strip of restricted diffusion along the high precentral gyrus on the right, 15 mm in length. Possible tiny left posterolateral medulla infarct. No hemorrhage, hydrocephalus, or mass. Mild background chronic small vessel ischemic change in the periventricular white matter. Vascular: Arterial findings below. Normal dural venous sinus flow voids. Skull and upper cervical spine: No marrow lesion noted. C2-3 facet arthropathy with mild anterolisthesis. Sinuses/Orbits: Bilateral cataract resection. T1 hyperintense material within the left petrous apex which does not appear expanded, likely trapped proteinaceous material. No restricted diffusion in this area. MRA HEAD FINDINGS Symmetric carotid artery size. Right ICA shelf-like appearance at the skullbase is likely artifactual based on source images. Strongly dominant right vertebral artery; the left vertebral artery ends in PICA. No major branch occlusion or reversible flow limiting stenosis. No notable atheromatous irregularity or evidence of inflammatory vasculopathy. Negative for aneurysm. IMPRESSION: 1. Numerous small acute infarcts in the bilateral cerebellum and along the bilateral cerebral convexity, a watershed/embolic pattern. 2. Background age congruent cerebral volume loss and chronic microvascular ischemia. 3. No acute finding or flow limiting stenosis on intracranial MRA. Electronically Signed   By: Monte Fantasia M.D.   On: 02/08/2017 15:59   Dg Chest Port 1 View  Result Date: 02/12/2017 CLINICAL DATA:  Respiratory failure. EXAM: PORTABLE CHEST 1 VIEW COMPARISON:  Single view of the chest 02/10/2017 and PA and lateral chest 02/06/2017. FINDINGS: A new feeding tube is in place and courses into the stomach and below the inferior margin of film. There has been  some improvement in right worse than left airspace disease. No pneumothorax.  Heart size is upper normal. IMPRESSION: Right worse than left airspace disease which could be due to edema or pneumonia shows some improvement since yesterday's exam. Electronically Signed   By: Inge Rise M.D.   On: 02/12/2017 08:51     Physical exam  Temp:  [97.5 F (36.4 C)-98.8 F (37.1 C)] 98.6 F (37 C) (04/21 0800) Pulse Rate:  [73-102] 89 (04/21 0700) Resp:  [17-38] 17 (04/21 0700) BP: (97-175)/(60-117) 142/80 (04/21 0700) SpO2:  [94 %-100 %] 97 % (04/21 0700) Weight:  [180 lb 1.9 oz (81.7 kg)] 180 lb 1.9 oz (81.7 kg) (04/21 0434)  General - Well nourished, well developed, unresponsive.  Ophthalmologic - Fundi not visualized due to noncooperation.  Cardiovascular - Regular rate and rhythm.  Neuro - unresponsive, not open eyes on voice or pain. With forced eye opening, eye midline, positive doll's eyes, positive corneal, spontaneous coughing with secretions. PERRL, not blinking to visual threat, right mild facial droop, tongue in middle in mouth, on pain stimulation, no significant movement, BLE mild withdraw, DTR 1+ and bilateral babinski positive. Sensation, coordination and gait not tested.    ASSESSMENT/PLAN Mr. Jery Hollern. is a 76 y.o. male with history of progressive L hand weakness, admitted with CP s/p cardiac cath who was noted to have L hand weakness by PT. He did not receive IV t-PA due to unknown LKW.   Stroke:  Bilateral anterior and posterior circulation  infarcts cardio embolic secondary to afib RVR and NSTEMI    Resultant  unresponsive  MRI  B Cerebellar and B cerebral embolic Infarcts. And changes of  small vessel disease.   MRA Unremarkable   CT head after neuro worsening no acute abnormalitites.  Repeat MRI pending for worsening mental status and for prognostication  Carotid Doppler unremarkable    2D Echo  EF 60-65%. No source of embolus   LDL 112  HgbA1c 4.9  Heparin IV for VTE prophylaxis Diet NPO time specified  No  antithrombotic prior to admission, now on aspirin 81 mg daily and heparin IV  Therapy recommendations:  pending  Disposition:  pending  R Facial Twitching, Possible seizure  New this admission  Ativan 0.5 mg resolved twitching  depacon 1gm IV followed by 500mg   q 8  EEG pending  depakote level - pending  NSTEMI  Troponin elevated 8-9  Cardiology on board  On heparin IV and ASA  afib RVR  Found on tele with short duration  Likely the cause of cardio embolic stroke this time  On heparin IV  AKI on CKD stage III  Elevated Cre 1.6->2.27  CCM on board  Consider IVF gentle hydration  BMP monitoring  Hypertension  Noncompliant with meds PTA, not taking consistently  Stable  Permissive hypertension (OK if < 180/105) but gradually normalize in 5-7 days  Long-term BP goal normotensive  Hyperlipidemia  Home meds:  zocor 20, resumed in hospital  LDL 112 goal < 70  On lipitor 80  Continue statin at discharge  ETOH abuse, withdrawal  On CIWA protocol  Other Stroke Risk Factors  Advanced age  Former Cigarette smoker  Other Active Problems  DNR status  Hospital day # 6   This patient is critically ill and at significant risk of neurological worsening, death and care requires constant monitoring of vital signs, hemodynamics,respiratory and cardiac monitoring, extensive review of multiple databases, frequent neurological assessment, discussion with family, other specialists and medical  decision making of high complexity.I have made any additions or clarifications directly to the above note.This critical care time does not reflect procedure time, or teaching time or supervisory time of PA/NP/Med Resident etc but could involve care discussion time. I spent 40 minutes of neurocritical care time  in the care of  this patient. I had long discussion with daughter at bedside, updated pt condition, treat options and possible prognosis. Answered all her questions.    Rosalin Hawking, MD PhD Stroke Neurology 02/12/2017 9:47 AM   To contact Stroke Continuity provider, please refer to [email or web address] After hours, contact General Neurology

## 2017-02-13 DIAGNOSIS — R569 Unspecified convulsions: Secondary | ICD-10-CM

## 2017-02-13 DIAGNOSIS — N179 Acute kidney failure, unspecified: Secondary | ICD-10-CM

## 2017-02-13 DIAGNOSIS — R063 Periodic breathing: Secondary | ICD-10-CM

## 2017-02-13 LAB — GLUCOSE, CAPILLARY
GLUCOSE-CAPILLARY: 331 mg/dL — AB (ref 65–99)
GLUCOSE-CAPILLARY: 289 mg/dL — AB (ref 65–99)
GLUCOSE-CAPILLARY: 146 mg/dL — AB (ref 65–99)

## 2017-02-13 LAB — BASIC METABOLIC PANEL
Anion gap: 13 (ref 5–15)
BUN: 67 mg/dL — AB (ref 6–20)
CALCIUM: 8.1 mg/dL — AB (ref 8.9–10.3)
CO2: 18 mmol/L — AB (ref 22–32)
CREATININE: 2.52 mg/dL — AB (ref 0.61–1.24)
Chloride: 110 mmol/L (ref 101–111)
GFR calc non Af Amer: 23 mL/min — ABNORMAL LOW (ref >60)
GFR, EST AFRICAN AMERICAN: 27 mL/min — AB (ref >60)
Glucose, Bld: 345 mg/dL — ABNORMAL HIGH (ref 65–99)
Potassium: 4.3 mmol/L (ref 3.5–5.1)
Sodium: 141 mmol/L (ref 135–145)

## 2017-02-13 LAB — CBC
HEMATOCRIT: 29.1 % — AB (ref 39.0–52.0)
HEMOGLOBIN: 9.7 g/dL — AB (ref 13.0–17.0)
MCH: 30.8 pg (ref 26.0–34.0)
MCHC: 33.3 g/dL (ref 30.0–36.0)
MCV: 92.4 fL (ref 78.0–100.0)
Platelets: 202 10*3/uL (ref 150–400)
RBC: 3.15 MIL/uL — ABNORMAL LOW (ref 4.22–5.81)
RDW: 13.3 % (ref 11.5–15.5)
WBC: 17.2 10*3/uL — AB (ref 4.0–10.5)

## 2017-02-13 LAB — MAGNESIUM: Magnesium: 2.4 mg/dL (ref 1.7–2.4)

## 2017-02-13 LAB — HEPARIN LEVEL (UNFRACTIONATED): HEPARIN UNFRACTIONATED: 0.73 [IU]/mL — AB (ref 0.30–0.70)

## 2017-02-13 LAB — PHOSPHORUS: Phosphorus: 4.2 mg/dL (ref 2.5–4.6)

## 2017-02-13 LAB — VALPROIC ACID LEVEL: Valproic Acid Lvl: 76 ug/mL (ref 50.0–100.0)

## 2017-02-13 MED ORDER — SODIUM CHLORIDE 0.9 % IV SOLN: 1.0000 mg/h | INTRAVENOUS | Status: AC

## 2017-02-13 MED ORDER — SODIUM CHLORIDE 0.9 % IV SOLN
2.0000 mg/h | INTRAVENOUS | Status: DC
  Administered 2017-02-13: 2 mg/h via INTRAVENOUS
  Filled 2017-02-13: qty 10

## 2017-02-13 MED ORDER — LORAZEPAM 2 MG/ML IJ SOLN: 1.0000 mg | INTRAMUSCULAR | Status: DC | PRN

## 2017-02-13 MED ORDER — MORPHINE SULFATE (PF) 2 MG/ML IV SOLN: 2.0000 mg | Freq: Once | INTRAVENOUS | Status: AC

## 2017-02-13 MED ORDER — LORAZEPAM 2 MG/ML IJ SOLN: 1.0000 mg | INTRAMUSCULAR | 0 refills | Status: AC | PRN

## 2017-02-13 NOTE — Progress Notes (Signed)
Called report to [facility] per [facility]). Awaiting notification of transports arrival. Family at bedside aware of plan.

## 2017-02-13 NOTE — Progress Notes (Addendum)
Daily Progress Note   Patient Name: Charles Hanson.       Date: 02/18/2017 DOB: [birth year]  Age: 76 y.o. MRN#: [id number] Attending Physician: Zenia Resides, MD Primary Care Physician: [facility] Admit Date: 02/06/2017  Reason for Consultation/Follow-up: Terminal Care  Subjective:  restless, obtunded, rapid labored breathing, coarse breath sounds Children are at the bedside, see below:  Length of Stay: 7  Current Medications: Scheduled Meds:  . aspirin EC  81 mg Oral Daily  . dorzolamide  1 drop Both Eyes BID  . folic acid  1 mg Intravenous Daily  . isosorbide mononitrate  30 mg Oral QHS  . latanoprost  1 drop Both Eyes QHS  . metoprolol  5 mg Intravenous Q4H  .  morphine injection  2 mg Intravenous Once  . sodium chloride flush  3 mL Intravenous Q12H  . thiamine  100 mg Oral Daily   Or  . thiamine  100 mg Intravenous Daily    Continuous Infusions: . sodium chloride    . ampicillin-sulbactam (UNASYN) IV Stopped (02/08/2017 3435)  . diltiazem (CARDIZEM) infusion 5 mg/hr (02/18/2017 0700)  . morphine    . valproate sodium Stopped (01/26/2017 0700)    PRN Meds: sodium chloride, acetaminophen, hydrALAZINE, LORazepam, morphine injection, nitroGLYCERIN, ondansetron (ZOFRAN) IV, sodium chloride flush  Physical Exam         Obtunded Coarse breath sounds Congested Not responsive S1 S2 Labored breathing Worsening edema Extremities warm to touch No coolness no mottling Abdomen distended  Vital Signs: BP 118/74   Pulse 85   Temp 97.9 F (36.6 C) (Axillary)   Resp (!) 27   Ht 5\' 8"  (1.727 m)   Wt 80.6 kg (177 lb 11.1 oz)   SpO2 100%   BMI 27.02 kg/m  SpO2: SpO2: 100 % O2 Device: O2 Device: Nasal Cannula O2 Flow Rate: O2 Flow Rate (L/min): 2  L/min  Intake/output summary:  Intake/Output Summary (Last 24 hours) at 02/02/2017 1116 Last data filed at 01/29/2017 0700  Gross per 24 hour  Intake          1761.47 ml  Output              500 ml  Net          1261.47 ml   LBM: Last BM Date:  02/12/17 Baseline Weight: Weight: 80.7 kg (178 lb) Most recent weight: Weight: 80.6 kg (177 lb 11.1 oz)       Palliative Assessment/Data:    Flowsheet Rows     Most Recent Value  Intake Tab  Referral Department  Hospitalist  Unit at Time of Referral  ICU  Palliative Care Primary Diagnosis  Other (Comment) [CVA CAD AKI CKD ]  Palliative Care Type  New Palliative care  Reason for referral  Clarify Goals of Care, End of Life Care Assistance, Counsel Regarding Hospice  Date first seen by Palliative Care  02/12/17  Clinical Assessment  Palliative Performance Scale Score  20%  Pain Max last 24 hours  4  Pain Min Last 24 hours  3  Dyspnea Max Last 24 Hours  4  Dyspnea Min Last 24 hours  3  Nausea Max Last 24 Hours  0  Nausea Min Last 24 Hours  0  Anxiety Max Last 24 Hours  3  Anxiety Min Last 24 Hours  2  Psychosocial & Spiritual Assessment  Palliative Care Outcomes  Patient/Family meeting held?  Yes  Who was at the meeting?  patient (who could not participate), 2 daughters and one son.   Palliative Care Outcomes  Clarified goals of care      Patient Active Problem List   Diagnosis Date Noted  . Atrial fibrillation ([place])   . Obtundation   . AKI (acute kidney injury) ([place])   . Focal seizure ([place])   . Aspiration pneumonia ([place])   . Change in mental status   . Cerebral embolism with cerebral infarction 02/09/2017  . Partial symptomatic epilepsy with complex partial seizures, not intractable, without status epilepticus ([place]) 02/09/2017  . Dysphagia   . Alcoholism ([place])   . Left hand weakness   . Pharyngeal dysphagia   . Abnormal loss of weight   . Malnutrition of moderate degree 02/07/2017  . Generalized weakness   . Secondary  hypertension   . Elevated troponin   . Non-ST elevation (NSTEMI) myocardial infarction ([place])   . Atypical chest pain 02/06/2017    Palliative Care Assessment & Plan   Patient Profile:    Assessment:  actively dying Dyspnea, tachypnea Air hunger Recent stroke, now possibly with worsening cerebral infarction.  Worsening renal function Recently diagnosed with CAD and A fib ?aspiraiton Potential for seizures.   Recommendations/Plan:  Comfort measures  Start Morphine drip  Ativan IV PRN  D/C tube feeds  CSW consult for residential hospice as per family preference and prior discussion.   Patient's prognosis likely not more than few hours to at the most some very limited number of days, discussed frankly and compassionately with family at the bedside  Comfort cart for family  RN may pronounce if patient passes away in the hospital.     Code Status:    Code Status Orders        Start     Ordered   02/10/17 1005  Do not attempt resuscitation (DNR)  Continuous    Question Answer Comment  In the event of cardiac or respiratory ARREST Do not call a "code blue"   In the event of cardiac or respiratory ARREST Do not perform Intubation, CPR, defibrillation or ACLS   In the event of cardiac or respiratory ARREST Use medication by any route, position, wound care, and other measures to relive pain and suffering. May use oxygen, suction and manual treatment of airway obstruction as needed for comfort.  02/10/17 1005    Code Status History    Date Active Date Inactive Code Status Order ID Comments User Context   02/06/2017  4:28 PM 02/10/2017 10:05 AM Full Code [id number]  [place] Bing, DO Inpatient    Advance Directive Documentation     Most Recent Value  Type of Advance Directive  Healthcare Power of Attorney  Pre-existing out of facility DNR order (yellow form or pink MOST form)  -  "MOST" Form in Place?  -       Prognosis:   Hours - Days  Discharge  Planning:  Anticipated Hospital Death versus transfer to hospice facility  Care plan was discussed with  2 daughters, one son, daughter in law, Therapist, sports.   Thank you for allowing the Palliative Medicine Team to assist in the care of this patient.   Time In: 10.30 Time Out: 11.10 Total Time 40 Prolonged Time Billed No       Greater than 50%  of this time was spent counseling and coordinating care related to the above assessment and plan.  Loistine Chance, MD [phone]  Please contact Palliative Medicine Team phone at [phone] for questions and concerns.

## 2017-02-13 NOTE — Progress Notes (Signed)
   Progress Note  Patient Name: Charles Hanson. Date of Encounter: 01/28/2017  Primary Cardiologist: Dr. Carlyle Dolly  See full note from yesterday. Telemetry shows sinus rhythm with occasional PACs, no atrial fibrillation. Medications include aspirin, IV Lopressor, IV diltiazem, and IV heparin. Palliative Care is now involved in light of neurological deterioration in the setting of recent stroke and spoke with family yesterday, I reviewed the note. Patient continues with DNR/DNI status. Hospice has being discussed.  Signed, Rozann Lesches, MD  02/09/2017, 8:24 AM

## 2017-02-13 NOTE — Progress Notes (Signed)
Pt family declined to have MRI done. Physician and MRI notified.

## 2017-02-13 NOTE — Clinical Social Work Note (Signed)
CSW notified that the family has decided on comfort care. CSW spoke to pt's dtr Otho Perl [phone] who reported the family wants to move forward with a residential hospice placement. Luann reports that the family has decided on [facility]. CSW reached out to [facility]. Olivia Mackie reports that there is a bed available. Olivia Mackie will meet with the family shortly to discuss and have paper work signed.   CSW will arrange transport to [facility] once all paperwork has been completed.  Sadao Weyer B. Joline Maxcy Clinical Social Work Dept Weekend Social Worker [phone] 1:11 PM

## 2017-02-13 NOTE — Progress Notes (Signed)
[facility] Liaison RN visit for [facility].  Received request from [place], [place] for family interest in [place] with request for transfer today. Chart reviewed. Met with family, 2 daughters Eustaquio Maize and Otho Perl and son Uvaldo Rising to confirm interest and explain services. Family agreeable to transfer today. Wilburn Cornelia, University aware. Registration paperwork completed. Dr. Orpah Melter to assume care per family request as family prefers hospice MD for specialty end of life care and patient does not have a primary care provider at this time.  Please fax discharge summary to [phone]. Done by Margaretmary Eddy, [facility] RN.  RN please call report to [phone].   [facility] transport called for by Margaretmary Eddy, RN at [phone]. They will call me upon dispatch of truck to notify so Morphine drip can be discontinued as close to transport as possible.   Please feel free to call with any hospice related questions or concerns.  Thank you, Margaretmary Eddy, [facility] Liaison [phone]  [place] are on AMION or feel free to call [phone] after hours.

## 2017-02-13 NOTE — Progress Notes (Signed)
STROKE TEAM PROGRESS NOTE   SUBJECTIVE (INTERVAL HISTORY) His daughters are at the bedside. He still poorly responsive, not open eyes with voice or pain stimulation, and increased work of breath with mild respiratory distress. On ASA and heparin IV. In NSR. Palliative care has involved and plan for residential hospice tomorrow. Not able to tolerate lay flat so MRI was cancelled.   OBJECTIVE Temp:  [97.9 F (36.6 C)-98.5 F (36.9 C)] 97.9 F (36.6 C) (04/22 0746) Pulse Rate:  [59-97] 85 (04/22 0700) Cardiac Rhythm: Atrial fibrillation (04/21 2000) Resp:  [20-32] 31 (04/22 0700) BP: (94-127)/(53-82) 106/78 (04/22 0700) SpO2:  [94 %-100 %] 94 % (04/22 0700) Weight:  [80.6 kg (177 lb 11.1 oz)] 80.6 kg (177 lb 11.1 oz) (04/22 0438)  CBC:   Recent Labs Lab 02/12/17 0540 01/27/2017 0443  WBC 18.8* 17.2*  HGB 11.0* 9.7*  HCT 31.7* 29.1*  MCV 92.2 92.4  PLT 192 932    Basic Metabolic Panel:   Recent Labs Lab 02/12/17 0540 02/12/17 1843 02/03/2017 0443  NA 140  --  141  K 3.9  --  4.3  CL 108  --  110  CO2 20*  --  18*  GLUCOSE 203*  --  345*  BUN 52*  --  67*  CREATININE 2.27*  --  2.52*  CALCIUM 8.2*  --  8.1*  MG 2.1 2.3 2.4  PHOS 4.7* 3.4 4.2   Imaging:  I have personally reviewed the radiological images below and agree with the radiology interpretations.  Ct Head Wo Contrast 02/09/2017 Areas of low-density now visible by CT scattered within both cerebral hemispheres, most notable at the right frontoparietal vertex. No evidence of hemorrhage or mass effect. Electronically Signed      Ct Head Wo Contrast  02/09/2017 No acute intracranial abnormalities demonstrated at CT. Chronic atrophy and small vessel ischemic changes.    Charles Hanson Head Wo Contrast 02/08/2017 1. Numerous small acute infarcts in the bilateral cerebellum and along the bilateral cerebral convexity, a watershed/embolic pattern.  2. Background age congruent cerebral volume loss and chronic microvascular  ischemia.  3. No acute finding or flow limiting stenosis on intracranial MRA.     Dg Chest Port 1 View 02/12/2017 Right worse than left airspace disease which could be due to edema or pneumonia shows some improvement since yesterday's exam.     Physical exam  Temp:  [97.9 F (36.6 C)-98.5 F (36.9 C)] 97.9 F (36.6 C) (04/22 0746) Pulse Rate:  [59-97] 85 (04/22 0700) Resp:  [20-32] 31 (04/22 0700) BP: (94-127)/(53-82) 106/78 (04/22 0700) SpO2:  [94 %-100 %] 94 % (04/22 0700) Weight:  [80.6 kg (177 lb 11.1 oz)] 80.6 kg (177 lb 11.1 oz) (04/22 0438)  General - Well nourished, well developed, unresponsive, mild respiratory distress.  Ophthalmologic - Fundi not visualized due to noncooperation.  Cardiovascular - Regular rate and rhythm.  Neuro - unresponsive, not open eyes on voice or pain. Cheyne-Stokes respiration pattern. With forced eye opening, eye midline, positive doll's eyes, positive corneal, spontaneous coughing with secretions. PERRL, not blinking to visual threat, right mild facial droop, tongue in middle in mouth, on pain stimulation, no significant movement, BLE mild withdraw, DTR 1+ and bilateral babinski positive. Sensation, coordination and gait not tested.    ASSESSMENT/PLAN Charles Hanson. is a 76 y.o. male with history of progressive L hand weakness, CKD, and Htn, admitted with CP s/p cardiac cath who was noted to have L hand weakness by PT. He  did not receive IV t-PA due to unknown LKW.   Stroke:  Bilateral anterior and posterior circulation  infarcts cardio embolic secondary to afib RVR and NSTEMI. Suspect worsening cerebral infarction.  Resultant  Unresponsive, Cheyne-Stokes respiration pattern  MRI  B Cerebellar and B cerebral embolic Infarcts. And changes of  small vessel disease.   MRA Unremarkable   CT head after neuro worsening no acute abnormalitites.  Not able to tolerate repeat MRI, which was cancelled. However, suspect worsening cerebral  infarction.  Carotid Doppler unremarkable    2D Echo  EF 60-65%. No source of embolus   LDL 112  HgbA1c 4.9  Heparin IV for VTE prophylaxis Diet NPO time specified  No antithrombotic prior to admission, now on aspirin 81 mg daily and heparin IV.  Agree with palliative care to consider comfort care measures  Disposition:  Pending for residential hospice  R Facial Twitching, Possible seizure  New this admission  Ativan 0.5 mg resolved twitching  depacon 1gm IV followed by 500mg   q 8  EEG - performed - interpretation pending  depakote level - 76 (50 - 100)  NSTEMI  Troponin elevated 8-9  Cardiology on board  On heparin IV and ASA  afib RVR  Found on tele with short duration  Likely the cause of cardio embolic stroke this time  On heparin IV  AKI on CKD stage III  Elevated Cre 1.6->2.27 -> 2.52  CCM on board  Consider IVF gentle hydration  BMP monitoring  Hypertension  Noncompliant with meds PTA, not taking consistently  Stable  Permissive hypertension (OK if < 180/105) but gradually normalize in 5-7 days  Long-term BP goal normotensive  Hyperlipidemia  Home meds:  zocor 20, resumed in hospital  LDL 112 goal < 70  On lipitor 80  Continue statin at discharge  ETOH abuse, withdrawal  On CIWA protocol  Other Stroke Risk Factors  Advanced age  Former Cigarette smoker  Other Active Problems  DNR status  Palliative Care Consult 02/12/2017 -[place] being considered -> we recommend comfort care measure   Hospital day # 7   This patient is critically ill and at significant risk of neurological worsening, death and care requires constant monitoring of vital signs, hemodynamics,respiratory and cardiac monitoring, extensive review of multiple databases, frequent neurological assessment, discussion with family, other specialists and medical decision making of high complexity.I have made any additions or clarifications directly to the  above note.This critical care time does not reflect procedure time, or teaching time or supervisory time of PA/NP/Med Resident etc but could involve care discussion time. I spent 40 minutes of neurocritical care time  in the care of  this patient. I had long discussion with daughters at bedside, updated pt condition, treat options and possible prognosis. Answered all her questions. Family agree with palliative care to consider comfort care measures.   Rosalin Hawking, MD PhD Stroke Neurology 02/08/2017 11:08 AM   To contact Stroke Continuity provider, please refer to [email or web address] After hours, contact General Neurology

## 2017-02-13 NOTE — Progress Notes (Signed)
Pt in route to [facility]. [facility] transporting pt. VS taken prior to transfer to stretcher. Family at bedside for transfer. Belongings sent with family.

## 2017-02-13 NOTE — Clinical Social Work Note (Signed)
Clinical Social Worker facilitated patient discharge including contacting patient family and facility to confirm patient discharge plans.  Clinical information faxed to facility and family agreeable with plan. CSW arranged ambulance transport via [facility] to [facility]. RN to call report [phone] prior to discharge.  Clinical Social Worker will sign off for now as social work intervention is no longer needed. Please consult [place] again if new need arises.  Euleta Belson B. Joline Maxcy Clinical Social Work Dept Weekend Social Worker [phone] 4:16 PM

## 2017-02-13 NOTE — Progress Notes (Signed)
SLP Cancellation Note  Patient Details Name: Charles Hanson. MRN: [id number] DOB: Aug 22, 1941   Cancelled treatment:       Reason Eval/Treat Not Completed: Fatigue/lethargy limiting ability to participate. Per chart review, note that family is pursuing possible transfer to inpatient hospice tomorrow. Per RN recommendation, will s/o at this time. Please re-order if needed.   [name] 02/09/2017, 11:13 AM  [name], M.A. CCC-SLP [phone]

## 2017-02-13 NOTE — Progress Notes (Signed)
 Family Medicine Teaching Service Daily Progress Note Intern Pager: [phone]  Patient name: Charles Hanson. Medical record number: [id number] Date of birth: September 09, 1941 Age: 76 y.o. Gender: male  Primary Care Provider: [place] Consultants: CCM, neurology, cardiology  Code Status: DNR  Pt Overview and Major Events to Date:  04/15: Admit for NSTEMI, heparin gtt initiated, minor AKI 04/16: LHC mild-mod dz, proceed with medical management, consider Myoview if symptoms persist 04/17: GI consulted for dysphagia, new onset left hand grip weakness, code stroke called 04/18: Worsening weakness, cannot see, left lateral deviation, troponin elevation, SVT/new Afib, transferred to ICU.  Cardizem drip started for rate control   Assessment and Plan: Charles Hansonis a 76 y.o.malepresenting with generalized weakness and chest pain. PMH is significant for HTN, HLD, CKDIII, NIDDM.  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 Dx by MRI brain. 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 EEG unremarkable. - Neuro consulted, appreciate recommendations: family declined repeat MRI on 4/21 - Depacon 500mg  q8 - ASA - PT/OT: SNF - palliative care consulted, appreciate assistance: meeting 4/21 family continues to agree with DNR/DNI. Family would like to continue tube feeds for now with current mode of care for the next 24 hours. Palliative discussed about proceeding with residential hospice full scope comfort measures in AM on 4/23 if pt has worsening of overall condition, they are agreeable.     Acute Respiratory Failure I Bilateral Lung Infiltrates I Aspiration PNA: Unasyn started in ICU - Unasyn day #5  # Inferior-NSTEMI  SVT I New Afib: Of note, hgb slighly decreased to 9.7 from 11 in the setting of IV heparin   --Cardiology consulted, appreciate recommendations:  cath 4/16/18showed p/mLAD lesion that was >67mm in length.  medical management.  - Cardizem drip - IV heparin - IV lopressor 5mg  q 4 hr  --ASA, lipitor, imdur per Cards --Monitor BP and HR   #Dysphasia: Subacute. Daughter noted 20 pound weight loss over 4 months. Patient is endorsing difficulty swallowing foods and liquids for the past several months. Patient failed SLP evaluation with fluids and liquids. Cortrak placed while in the ICU --GI resulted, appreciate recommendations: unable to see patient on 4/18 - tube feeds currently  #Hypertension  Hyperlipidemia:Chronic.  - Imdur -- Lipitor 80 mg QD -- ASA 81 mg QD  #Acute kidney injury  Chronic kidney disease stage III, worsening: Cr 2.52 (from 2.27). Baseline 1.73 per chart review.  -- Avoid nephrotoxic agents -- Daily BMET  #Non-insulin-dependent diabetes mellitus: A1c 4.9 during admission.CBGs 192 to 331 -- CBG q 4 hr while on tube feeds  -- mod SSI q 4hr   FEN/GI: NPO with TF Prophylaxis: Heparin drip  Disposition:continued management. Palliative Care Consulted.   Subjective:  Patient does not arouse to voice or tactile stimuli.  Family at bedside; reports that the plan is to go to residential hospice 4/23.   Objective: Temp:  [97.9 F (36.6 C)-98.6 F (37 C)] 98.4 F (36.9 C) (04/22 0348) Pulse Rate:  [59-97] 77 (04/22 0500) Resp:  [17-32] 28 (04/22 0500) BP: (94-142)/(53-82) 101/72 (04/22 0500) SpO2:  [96 %-100 %] 100 % (04/22 0500) Weight:  [80.6 kg (177 lb 11.1 oz)] 80.6 kg (177 lb 11.1 oz) (04/22 0438) Physical Exam: GEN: NAD does not respond to voice or tactile stimulation  HEENT: PERRL, no eye movements noted CV: RRR, no murmurs, rubs, or gallops PULM: CTAB, normal effort; NG tube noted ABD: Soft, nondistended, NABS,  no organomegaly SKIN: No rash or cyanosis; warm and well-perfused EXTR: No lower extremity edema or calf tenderness PSYCH: Mood and affect euthymic, normal rate and volume of speech NEURO: does not respond to voice or tactile stimulation     Laboratory:  Recent Labs Lab 02/11/17 0245 02/12/17 0540 02/12/2017 0443  WBC 19.7* 18.8* 17.2*  HGB 10.2* 11.0* 9.7*  HCT 30.4* 31.7* 29.1*  PLT 195 192 202    Recent Labs Lab 02/06/17 1330  02/09/17 0445 02/10/17 0131 02/12/17 0540  NA  --   < > 134* 137 140  K  --   < > 3.8 4.3 3.9  CL  --   < > 105 107 108  CO2  --   < > 22 22 20*  BUN  --   < > 21* 21* 52*  CREATININE  --   < > 1.51* 1.63* 2.27*  CALCIUM  --   < > 8.3* 8.3* 8.2*  PROT 5.9*  --   --   --   --   BILITOT 1.0  --   --   --   --   ALKPHOS 128*  --   --   --   --   ALT 29  --   --   --   --   AST 35  --   --   --   --   GLUCOSE  --   < > 207* 129* 203*  < > = values in this interval not displayed.   Smiley Houseman, MD , 5:18 AM PGY-2, [place] Intern pager: [phone], text pages welcome

## 2017-02-13 NOTE — Progress Notes (Signed)
ANTICOAGULATION CONSULT NOTE - Follow Up Consult  Pharmacy Consult for Heparin Indication: atrial fibrillation, stroke and NSTEMI  No Known Allergies  Patient Measurements: Height: 5\' 8"  (172.7 cm) Weight: 177 lb 11.1 oz (80.6 kg) IBW/kg (Calculated) : 68.4 Heparin Dosing Weight: 80.7 kg  Vital Signs: Temp: 98.4 F (36.9 C) (04/22 0348) Temp Source: Axillary (04/22 0348) BP: 101/72 (04/22 0500) Pulse Rate: 77 (04/22 0500)  Labs:  Recent Labs  02/10/17 0729  02/11/17 0245 02/11/17 1629 02/11/17 2201  02/12/17 0540 02/12/17 1144 02/12/17 1843 01/28/2017 0443  HGB  --   < > 10.2*  --   --   --  11.0*  --   --  9.7*  HCT  --   --  30.4*  --   --   --  31.7*  --   --  29.1*  PLT  --   --  195  --   --   --  192  --   --  202  HEPARINUNFRC  --   --   --   --   --   < >  --  0.49 0.53 0.73*  CREATININE  --   --   --   --   --   --  2.27*  --   --  2.52*  TROPONINI 7.78*  --   --  8.82* 7.71*  --   --   --   --   --   < > = values in this interval not displayed.  Estimated Creatinine Clearance: 23.8 mL/min (A) (by C-G formula based on SCr of 2.52 mg/dL (H)).  Assessment: 76 yr old Hanson continues on IV heparin for afib and NSTEMI. Heparin level now up to supratherapeutic (0.73). Hgb down to 9.7, nobleeding noted.   Goal of Therapy:  Heparin level 0.3-0.5 units/ml Monitor platelets by anticoagulation protocol: Yes   Plan:  Decrease heparin gtt to 1050 units/hr Will f/u 8 hr heparin level  Sherlon Handing, PharmD, BCPS Clinical pharmacist, pager [phone]  01/28/2017 5:46 AM

## 2017-02-22 DEATH — deceased

## 2017-09-14 IMAGING — US US BIOPSY
1 series · 13 of 17 positions shown · non-contrast
Comparison: None

CLINICAL DATA: Chronic Kidney disease Stage III Persistent
proteinuria Nephrotic syndrome

EXAM:
ULTRASOUND GUIDED RENAL CORE BIOPSY
TECHNIQUE: The procedure, risks (including but not limited to bleeding,
infection, organ damage ), benefits, and alternatives were explained
to the patient. Questions regarding the procedure were encouraged
and answered. The patient understands and consents to the procedure.
Survey ultrasound was performed and an appropriate skin entry site
was localized. Site was marked, prepped with Betadine, draped in
usual sterile fashion, infiltrated locally with 1% lidocaine.
Intravenous Fentanyl and Versed were administered as conscious
sedation during continuous cardiorespiratory monitoring by the
radiology RN with a total moderate sedation time of 15 minutes.

[Series 1: us biopsy · 0.26mm/px · 13 of 17 slices shown]
[im 1/17]
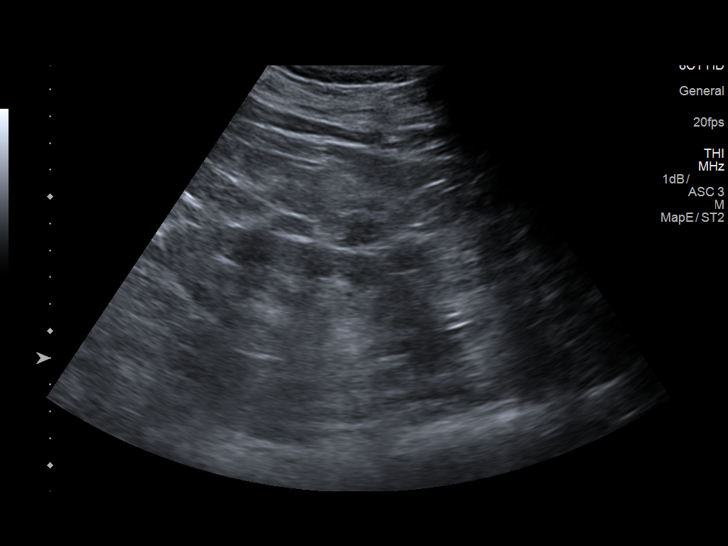
[im 2/17]
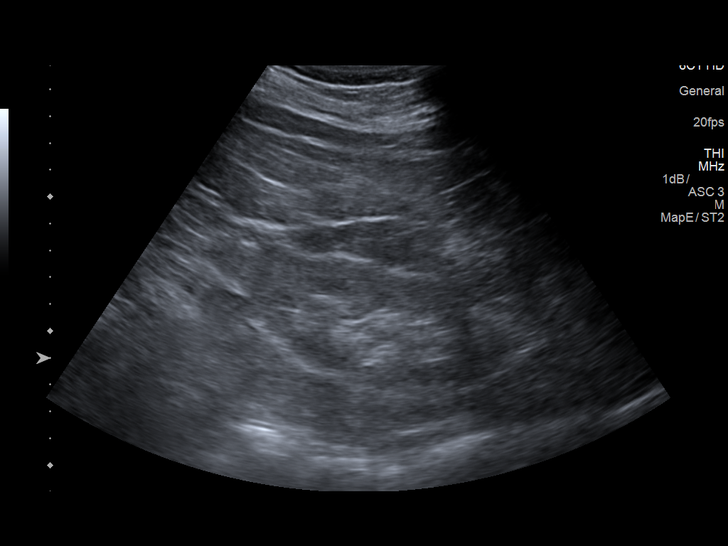
[im 4/17]
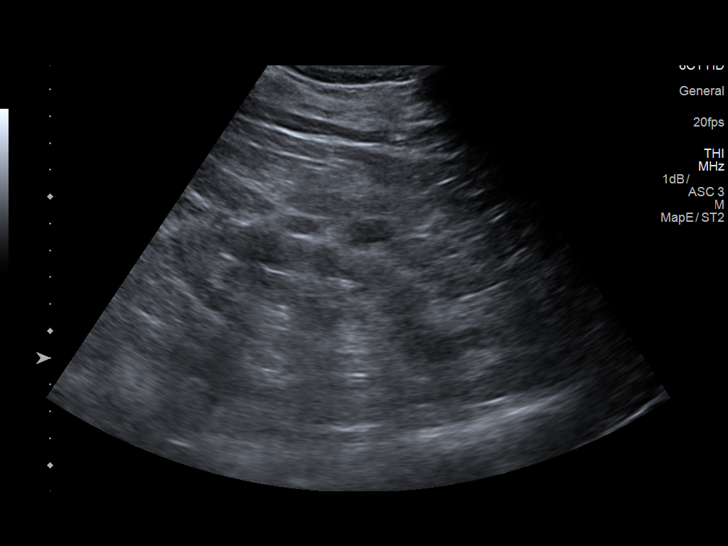
[im 5/17]
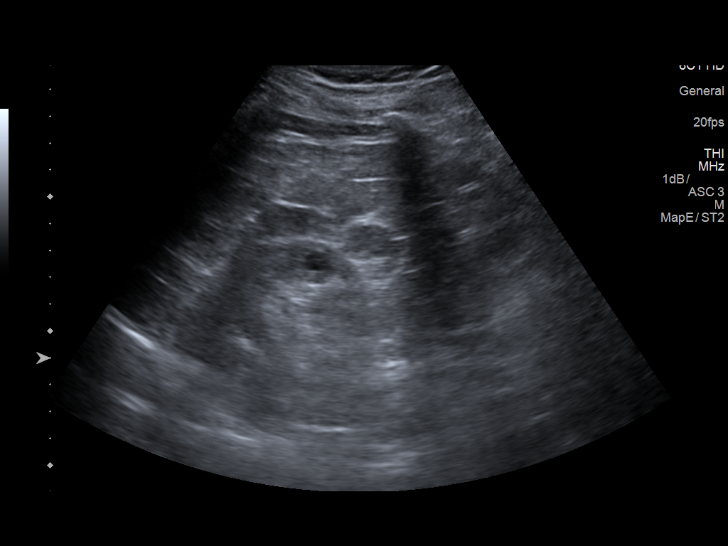
[im 6/17]
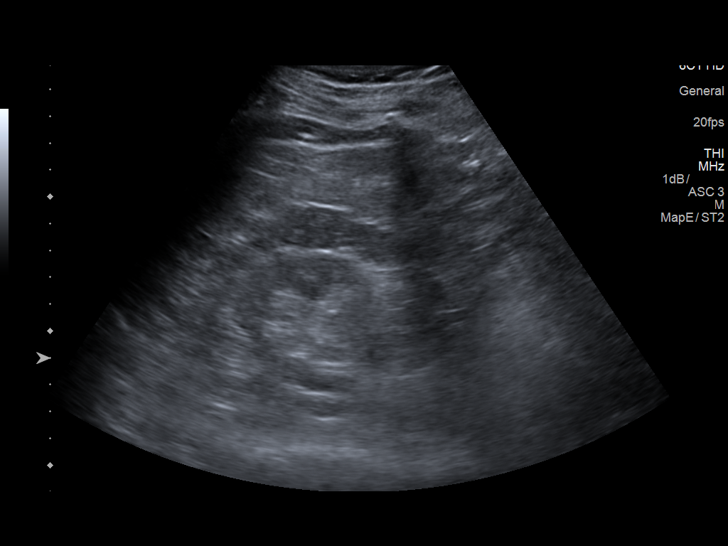
[im 8/17]
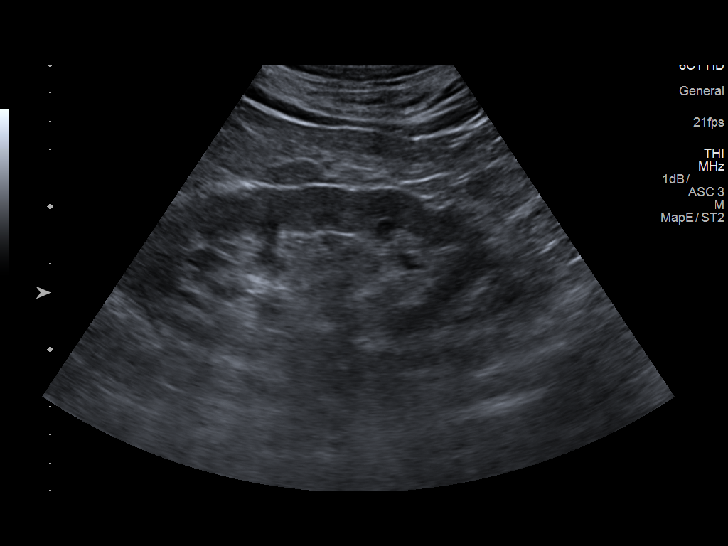
[im 9/17]
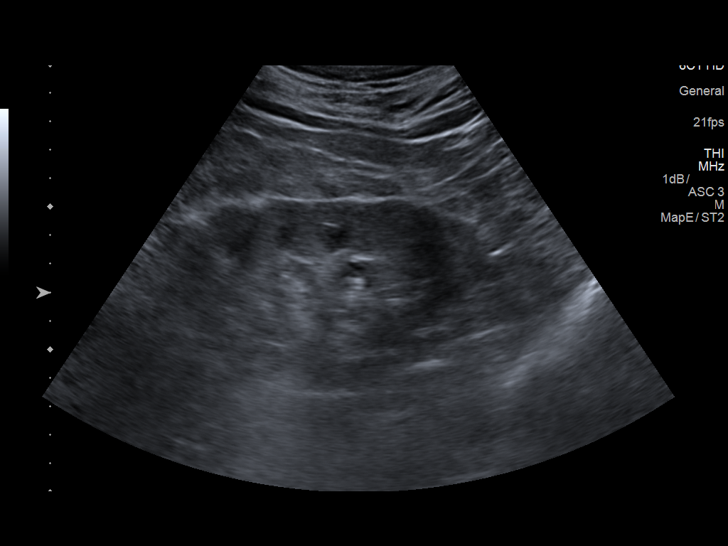
[im 10/17]
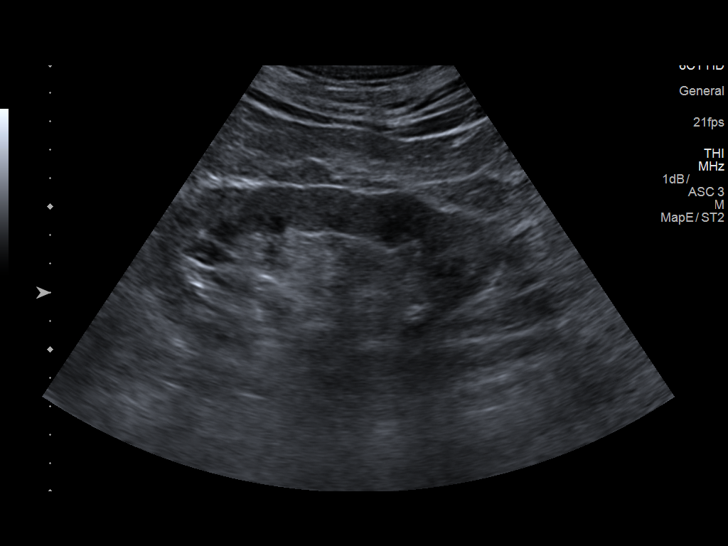
[im 12/17]
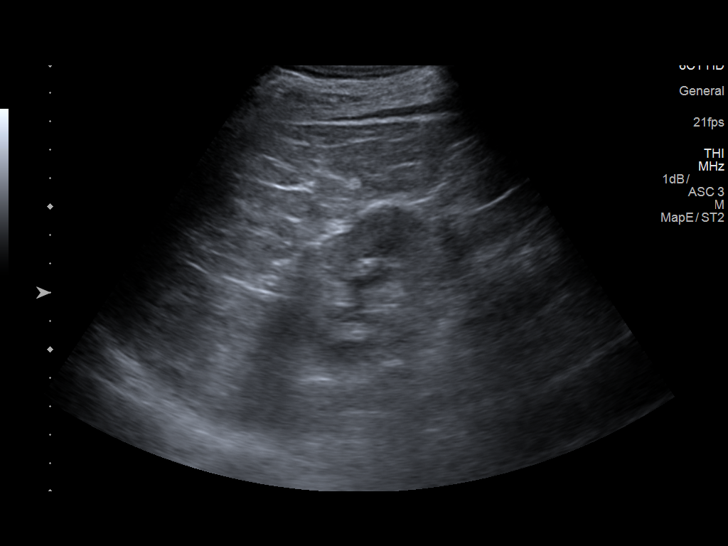
[im 13/17]
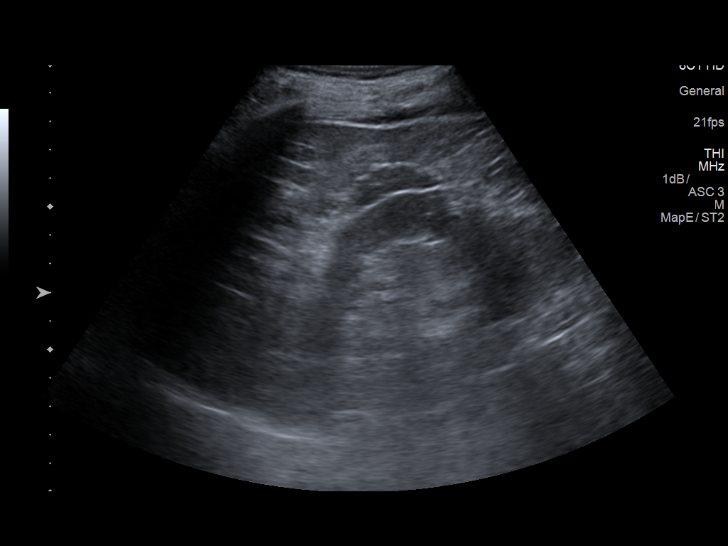
[im 14/17]
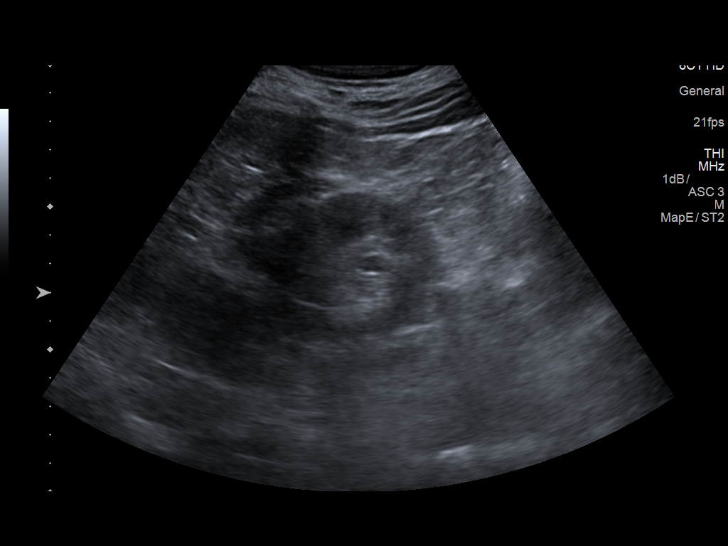
[im 16/17]
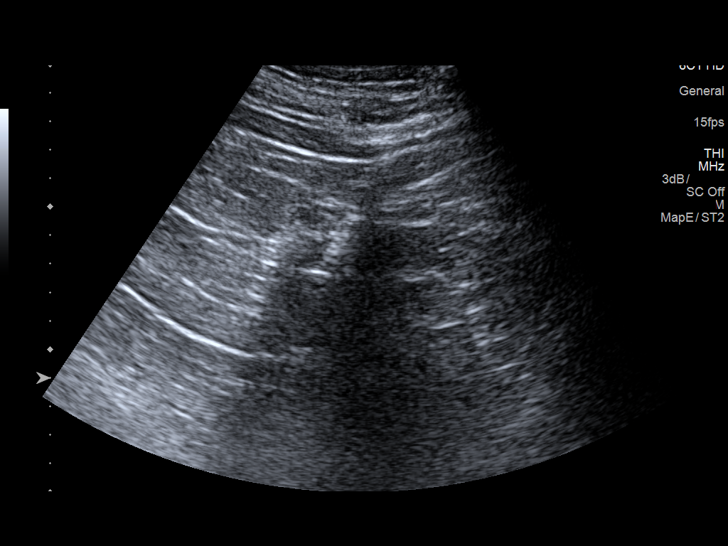
[im 17/17]
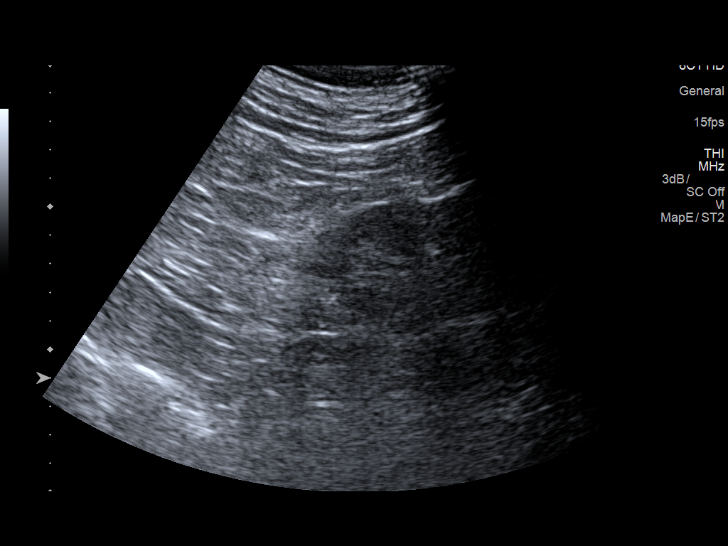

[13 of 17 positions shown; findings below may reference images not displayed]

Under real time ultrasound guidance, a 15 gauge trocar needle was
advanced to the margin of the lower pole right kidney. Coaxial 16
gauge Biopince core needle was advanced for 2 passes. The core
samples were submitted to pathology. The patient tolerated procedure
well.

COMPLICATIONS:
None immediate
IMPRESSION: 1. Technically successful ultrasound-guided core renal biopsy ,
lower pole right kidney

## 2017-12-28 IMAGING — DX DG CHEST 2V
2 series · 2 of 2 positions shown · non-contrast
Comparison: None.

CLINICAL DATA: Pt c/o feeling fatigue and having generalized
weakness ongoing for "a while." Pt reports seen by PMD for same but
not getting any answers. Pt also c/o burning sensation in the middle
of his chest when he eats.

EXAM:
CHEST  2 VIEW

[w chest pa]
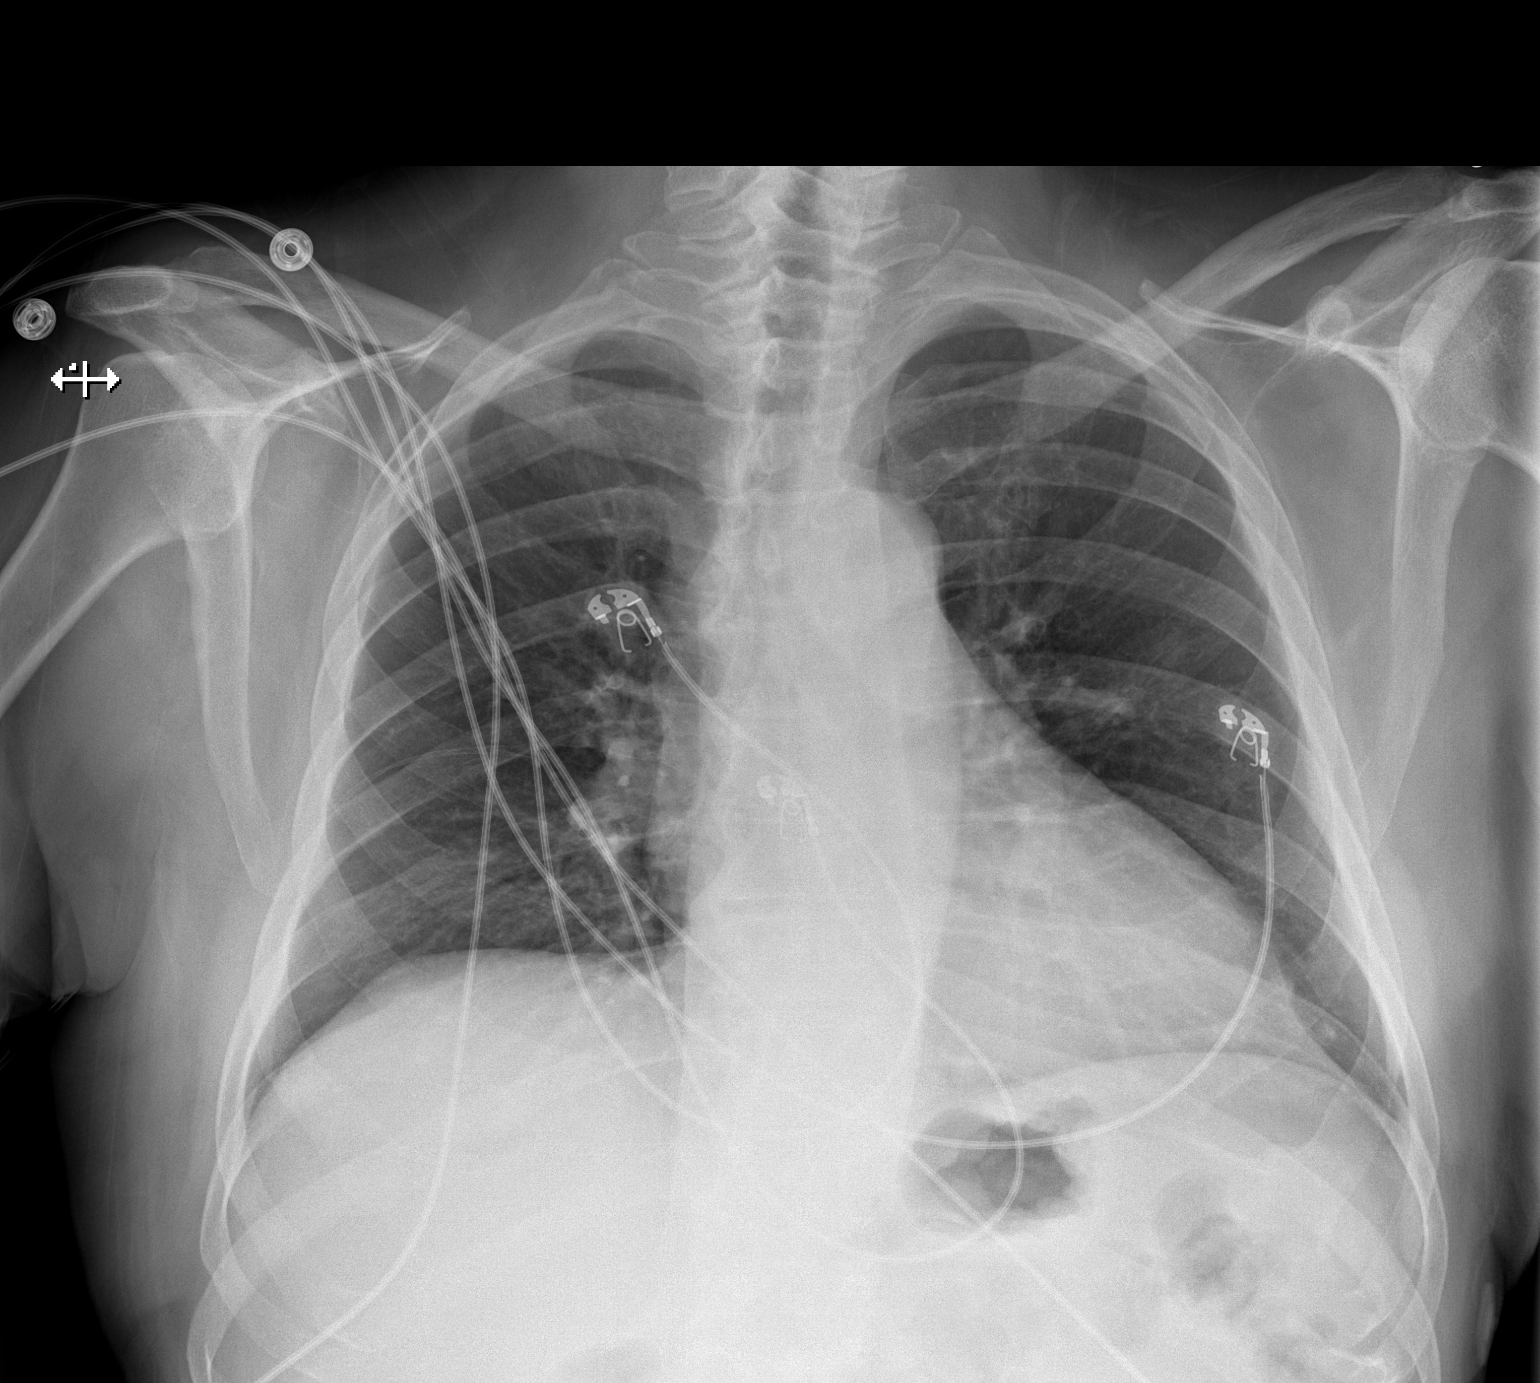

[w chest lat]
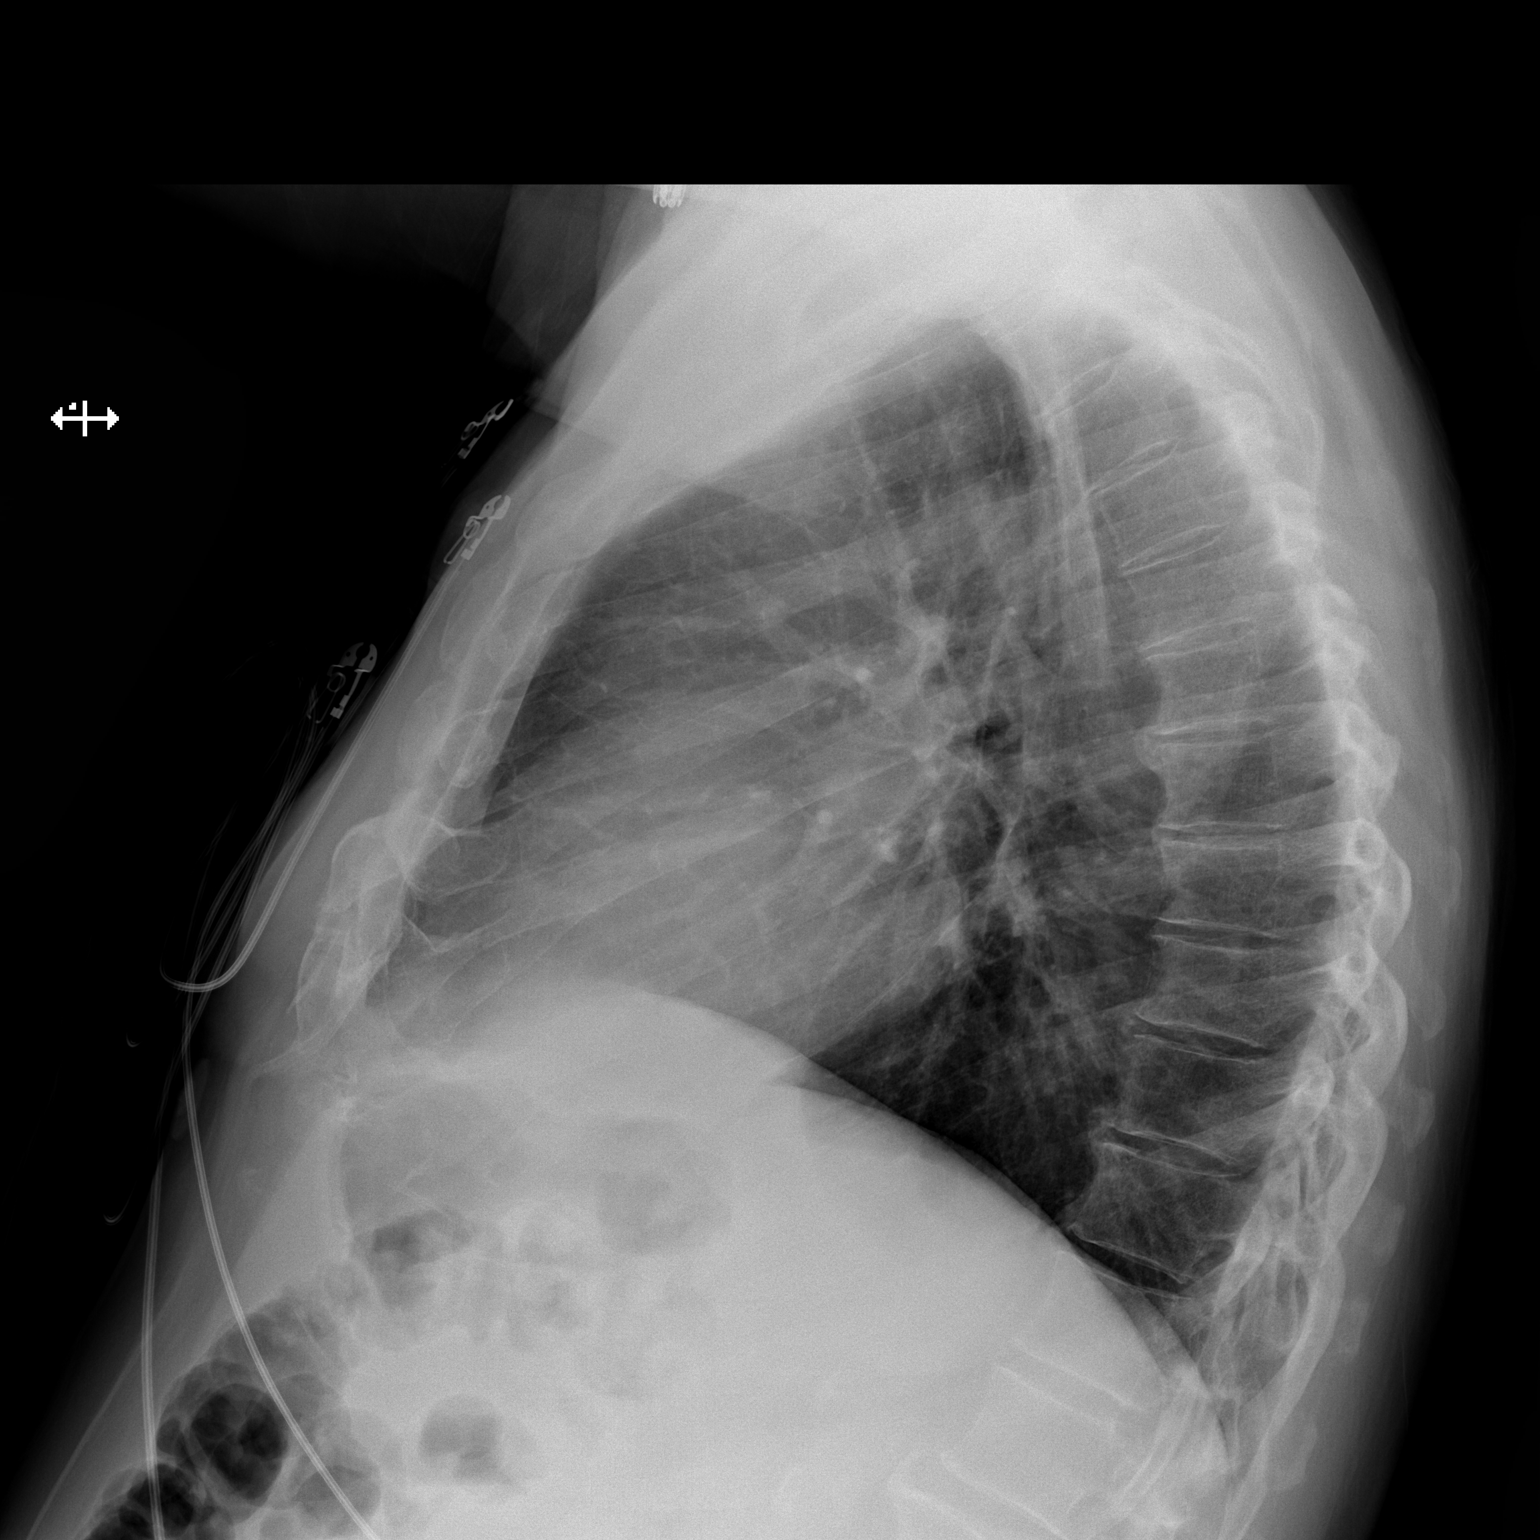

[2 of 2 positions shown; findings below may reference images not displayed]

FINDINGS: Normal mediastinum and cardiac silhouette. Normal pulmonary
vasculature. No evidence of effusion, infiltrate, or pneumothorax.
No acute bony abnormality. Degenerative osteophytosis of the spine.
IMPRESSION: No acute cardiopulmonary process.

## 2018-01-01 IMAGING — CR DG CHEST 1V PORT
1 series · 1 of 1 positions shown · non-contrast
Comparison: 02/06/2017

CLINICAL DATA: Shortness of Breath

EXAM:
PORTABLE CHEST 1 VIEW

[portable]
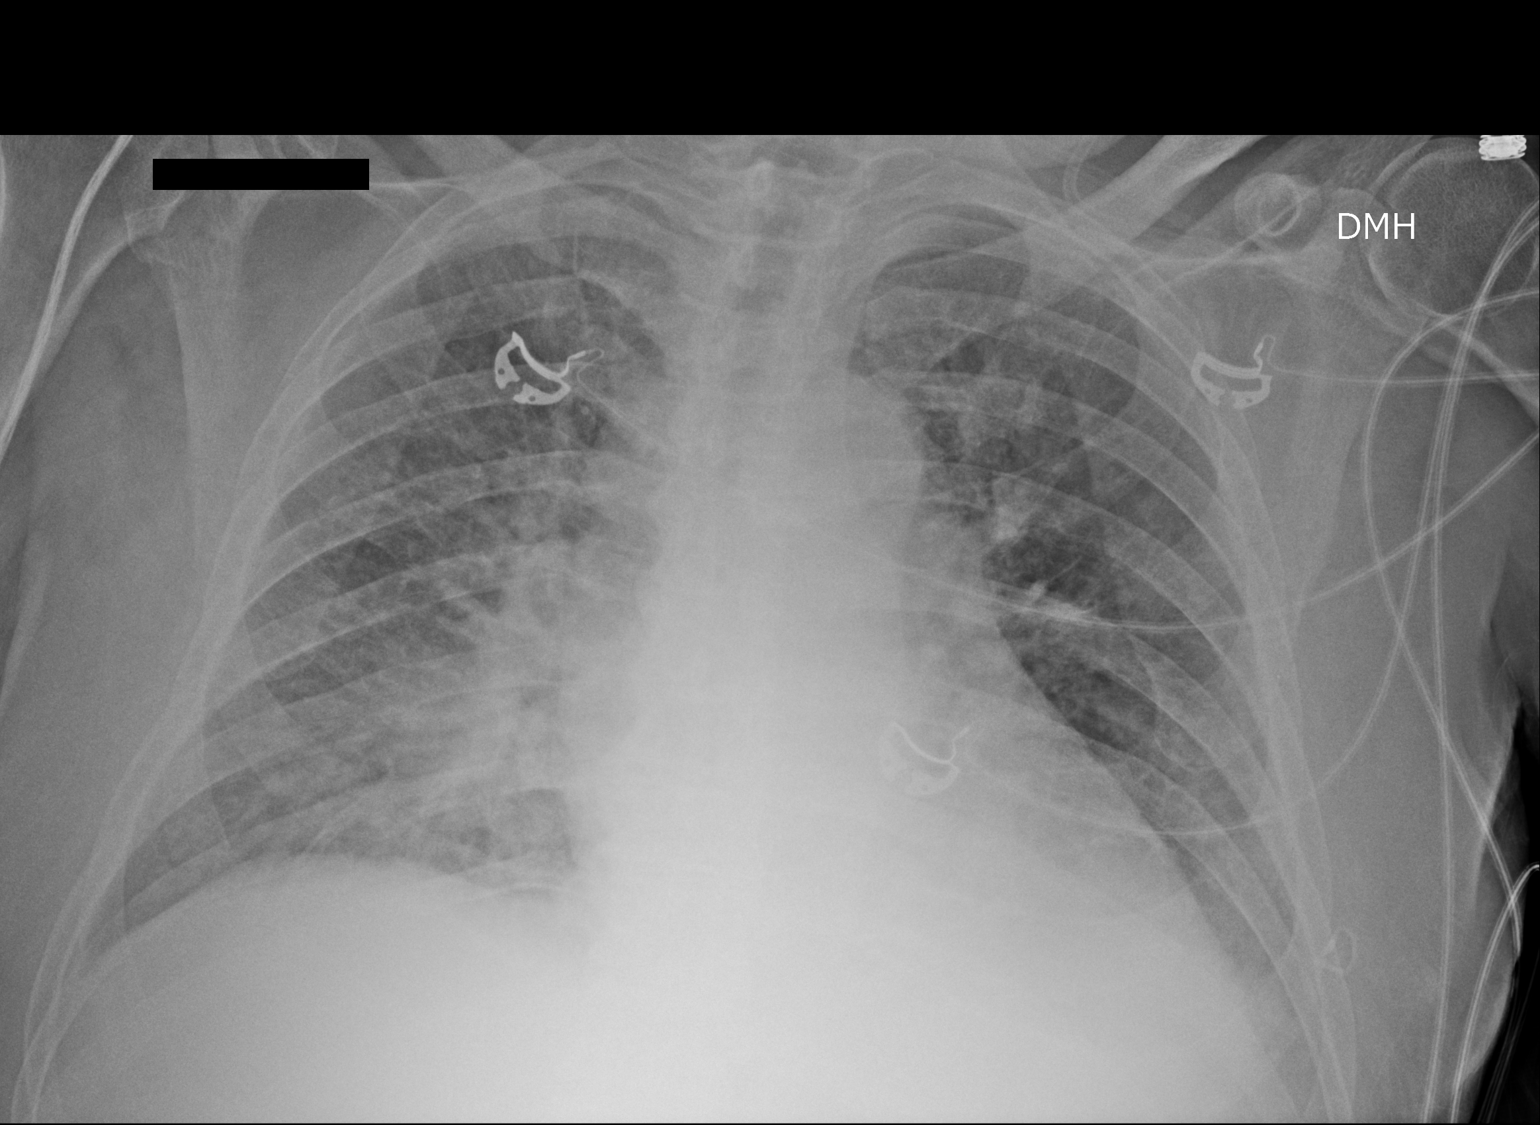

[1 of 1 positions shown; findings below may reference images not displayed]

FINDINGS: Borderline cardiomegaly. There is central vascular congestion,
bilateral interstitial prominence and hazy central airspace
opacification right greater than left. Findings highly suspicious
for bilateral pulmonary edema rather than bilateral pneumonia.
Question small left pleural effusion.
IMPRESSION: There is central vascular congestion, bilateral interstitial
prominence and hazy central airspace opacification right greater
than left. Findings highly suspicious for bilateral pulmonary edema
rather than bilateral pneumonia. Question small left pleural
effusion.
